# Patient Record
Sex: Female | Born: 1960
Health system: Southern US, Community
[De-identification: ages and names within clinical notes are randomized; demographics above are authoritative.]

## PROBLEM LIST (undated history)

## (undated) DIAGNOSIS — M1990 Unspecified osteoarthritis, unspecified site: Secondary | ICD-10-CM

## (undated) DIAGNOSIS — D649 Anemia, unspecified: Secondary | ICD-10-CM

## (undated) DIAGNOSIS — I10 Essential (primary) hypertension: Secondary | ICD-10-CM

## (undated) DIAGNOSIS — I639 Cerebral infarction, unspecified: Secondary | ICD-10-CM

## (undated) DIAGNOSIS — G7000 Myasthenia gravis without (acute) exacerbation: Secondary | ICD-10-CM

## (undated) DIAGNOSIS — F99 Mental disorder, not otherwise specified: Secondary | ICD-10-CM

## (undated) DIAGNOSIS — E7800 Pure hypercholesterolemia, unspecified: Secondary | ICD-10-CM

## (undated) HISTORY — DX: Pure hypercholesterolemia, unspecified: E78.00

## (undated) HISTORY — PX: BREAST LUMPECTOMY: SHX2

## (undated) HISTORY — DX: Essential (primary) hypertension: I10

## (undated) HISTORY — DX: Cerebral infarction, unspecified: I63.9

## (undated) HISTORY — PX: MASTECTOMY: SHX3

## (undated) HISTORY — DX: Rheumatoid arthritis, unspecified: M06.9

## (undated) HISTORY — PX: OTHER SURGICAL HISTORY: SHX169

## (undated) HISTORY — DX: Mental disorder, not otherwise specified: F99

---

## 2020-07-24 ENCOUNTER — Emergency Department (HOSPITAL_COMMUNITY): Payer: BC Managed Care – PPO

## 2020-07-24 ENCOUNTER — Encounter (HOSPITAL_COMMUNITY): Payer: Self-pay | Admitting: *Deleted

## 2020-07-24 ENCOUNTER — Inpatient Hospital Stay (HOSPITAL_COMMUNITY)
Admission: EM | Admit: 2020-07-24 | Discharge: 2020-07-26 | DRG: 066 | Disposition: A | Discharge status: Home / Self Care | Payer: BC Managed Care – PPO | Attending: Family Medicine | Admitting: Family Medicine

## 2020-07-24 ENCOUNTER — Other Ambulatory Visit: Payer: Self-pay

## 2020-07-24 DIAGNOSIS — K068 Other specified disorders of gingiva and edentulous alveolar ridge: Secondary | ICD-10-CM | POA: Diagnosis present

## 2020-07-24 DIAGNOSIS — E876 Hypokalemia: Secondary | ICD-10-CM | POA: Diagnosis not present

## 2020-07-24 DIAGNOSIS — Z20822 Contact with and (suspected) exposure to covid-19: Secondary | ICD-10-CM | POA: Diagnosis not present

## 2020-07-24 DIAGNOSIS — R4781 Slurred speech: Secondary | ICD-10-CM | POA: Diagnosis not present

## 2020-07-24 DIAGNOSIS — G7000 Myasthenia gravis without (acute) exacerbation: Secondary | ICD-10-CM | POA: Diagnosis present

## 2020-07-24 DIAGNOSIS — I6389 Other cerebral infarction: Secondary | ICD-10-CM | POA: Diagnosis not present

## 2020-07-24 DIAGNOSIS — R1111 Vomiting without nausea: Secondary | ICD-10-CM | POA: Diagnosis not present

## 2020-07-24 DIAGNOSIS — Z8669 Personal history of other diseases of the nervous system and sense organs: Secondary | ICD-10-CM | POA: Diagnosis not present

## 2020-07-24 DIAGNOSIS — R29810 Facial weakness: Secondary | ICD-10-CM | POA: Diagnosis not present

## 2020-07-24 DIAGNOSIS — R000 Tachycardia, unspecified: Secondary | ICD-10-CM | POA: Diagnosis present

## 2020-07-24 DIAGNOSIS — I639 Cerebral infarction, unspecified: Secondary | ICD-10-CM | POA: Diagnosis not present

## 2020-07-24 DIAGNOSIS — R2689 Other abnormalities of gait and mobility: Secondary | ICD-10-CM | POA: Diagnosis present

## 2020-07-24 DIAGNOSIS — M069 Rheumatoid arthritis, unspecified: Secondary | ICD-10-CM | POA: Diagnosis not present

## 2020-07-24 DIAGNOSIS — R9431 Abnormal electrocardiogram [ECG] [EKG]: Secondary | ICD-10-CM | POA: Diagnosis not present

## 2020-07-24 DIAGNOSIS — R772 Abnormality of alphafetoprotein: Secondary | ICD-10-CM | POA: Diagnosis present

## 2020-07-24 DIAGNOSIS — R531 Weakness: Secondary | ICD-10-CM | POA: Diagnosis present

## 2020-07-24 DIAGNOSIS — R1110 Vomiting, unspecified: Secondary | ICD-10-CM

## 2020-07-24 HISTORY — DX: Unspecified osteoarthritis, unspecified site: M19.90

## 2020-07-24 HISTORY — DX: Cerebral infarction, unspecified: I63.9

## 2020-07-24 HISTORY — DX: Myasthenia gravis without (acute) exacerbation: G70.00

## 2020-07-24 LAB — URINALYSIS, ROUTINE W REFLEX MICROSCOPIC
Bacteria, UA: NONE SEEN
Bilirubin Urine: NEGATIVE
Glucose, UA: NEGATIVE mg/dL
Ketones, ur: 20 mg/dL — AB
Leukocytes,Ua: NEGATIVE
Nitrite: NEGATIVE
Protein, ur: NEGATIVE mg/dL
Specific Gravity, Urine: 1.009 (ref 1.005–1.030)
pH: 7 (ref 5.0–8.0)

## 2020-07-24 LAB — COMPREHENSIVE METABOLIC PANEL
ALT: 19 U/L (ref 0–44)
AST: 18 U/L (ref 15–41)
Albumin: 3.8 g/dL (ref 3.5–5.0)
Alkaline Phosphatase: 130 U/L — ABNORMAL HIGH (ref 38–126)
Anion gap: 10 (ref 5–15)
BUN: 12 mg/dL (ref 6–20)
CO2: 25 mmol/L (ref 22–32)
Calcium: 9.1 mg/dL (ref 8.9–10.3)
Chloride: 105 mmol/L (ref 98–111)
Creatinine, Ser: 0.84 mg/dL (ref 0.44–1.00)
GFR, Estimated: >60 mL/min (ref >60)
Glucose, Bld: 83 mg/dL (ref 70–99)
Potassium: 3.4 mmol/L — ABNORMAL LOW (ref 3.5–5.1)
Sodium: 140 mmol/L (ref 135–145)
Total Bilirubin: 0.8 mg/dL (ref 0.3–1.2)
Total Protein: 8.3 g/dL — ABNORMAL HIGH (ref 6.5–8.1)

## 2020-07-24 LAB — CBC WITH DIFFERENTIAL/PLATELET
Abs Immature Granulocytes: 0.06 10*3/uL (ref 0.00–0.07)
Basophils Absolute: 0.1 10*3/uL (ref 0.0–0.1)
Basophils Relative: 1 %
Eosinophils Absolute: 0.1 10*3/uL (ref 0.0–0.5)
Eosinophils Relative: 2 %
HCT: 43.2 % (ref 36.0–46.0)
Hemoglobin: 13.8 g/dL (ref 12.0–15.0)
Immature Granulocytes: 1 %
Lymphocytes Relative: 25 %
Lymphs Abs: 1.5 10*3/uL (ref 0.7–4.0)
MCH: 26.5 pg (ref 26.0–34.0)
MCHC: 31.9 g/dL (ref 30.0–36.0)
MCV: 82.9 fL (ref 80.0–100.0)
Monocytes Absolute: 0.6 10*3/uL (ref 0.1–1.0)
Monocytes Relative: 10 %
Neutro Abs: 3.7 10*3/uL (ref 1.7–7.7)
Neutrophils Relative %: 61 %
Platelets: 350 10*3/uL (ref 150–400)
RBC: 5.21 MIL/uL — ABNORMAL HIGH (ref 3.87–5.11)
RDW: 13 % (ref 11.5–15.5)
WBC: 5.9 10*3/uL (ref 4.0–10.5)
nRBC: 0 % (ref 0.0–0.2)

## 2020-07-24 LAB — RESPIRATORY PANEL BY RT PCR (FLU A&B, COVID)
Influenza A by PCR: NEGATIVE
Influenza B by PCR: NEGATIVE
SARS Coronavirus 2 by RT PCR: NEGATIVE

## 2020-07-24 MED ORDER — PANTOPRAZOLE SODIUM 40 MG IV SOLR
40.0000 mg | INTRAVENOUS | Status: DC
  Administered 2020-07-25 (×2): 40 mg via INTRAVENOUS
  Filled 2020-07-24 (×2): qty 40

## 2020-07-24 MED ORDER — ASPIRIN 81 MG PO CHEW
81.0000 mg | CHEWABLE_TABLET | Freq: Once | ORAL | Status: AC
  Administered 2020-07-24: 81 mg via ORAL
  Filled 2020-07-24: qty 1

## 2020-07-24 MED ORDER — HYDRALAZINE HCL 20 MG/ML IJ SOLN
10.0000 mg | Freq: Once | INTRAMUSCULAR | Status: AC
  Administered 2020-07-24: 10 mg via INTRAVENOUS
  Filled 2020-07-24: qty 1

## 2020-07-24 MED ORDER — HYDRALAZINE HCL 10 MG PO TABS
10.0000 mg | ORAL_TABLET | Freq: Once | ORAL | Status: DC
  Filled 2020-07-24 (×2): qty 1

## 2020-07-24 MED ORDER — PROCHLORPERAZINE EDISYLATE 10 MG/2ML IJ SOLN
10.0000 mg | Freq: Four times a day (QID) | INTRAMUSCULAR | Status: DC | PRN
  Administered 2020-07-25: 10 mg via INTRAVENOUS
  Filled 2020-07-24: qty 2

## 2020-07-24 MED ORDER — POTASSIUM CHLORIDE CRYS ER 20 MEQ PO TBCR
40.0000 meq | EXTENDED_RELEASE_TABLET | Freq: Once | ORAL | Status: AC
  Administered 2020-07-25: 40 meq via ORAL
  Filled 2020-07-24: qty 2

## 2020-07-24 NOTE — ED Provider Notes (Signed)
Patient signed out to me by Langston Masker, PA-C pending completion of work-up.  Patient with history of myasthenia gravis although she has not been on any medications for more than 10 years.  Had thymectomy.  here with facial droop.  Reported last known normal was Sunday.  Physical exam shows some mild right facial weakness and garbled speech.  MRI/MRA ordered.  Previous provider spoke with neurology, Dr. Otelia Limes, who requested to be consulted when MRI completed.   2019 spoke with the neurologist, Dr. Otelia Limes after review of the MRI.  He recommends to start 81 mg aspirin daily and maintain systolic blood pressure between 120-140.  He felt that patient was appropriate to stay at [facility] neurology here tomorrow. Will consult hospitalist  2100  Consulted hospitalist, Dr. Thomes Dinning who agrees to admit   CT Head Wo Contrast  Result Date: 07/24/2020 CLINICAL DATA:  Facial droop EXAM: CT HEAD WITHOUT CONTRAST TECHNIQUE: Contiguous axial images were obtained from the base of the skull through the vertex without intravenous contrast. COMPARISON:  None. FINDINGS: Brain: No acute territorial infarction, hemorrhage or intracranial mass. Minimal right frontal subcortical white matter hypodensity. Ventricles nonenlarged. Prominent right parafalcine calcification. Vascular: No hyperdense vessel or unexpected calcification. Mild carotid vascular calcification Skull: Normal. Negative for fracture or focal lesion. Sinuses/Orbits: No acute finding. Other: None IMPRESSION: 1. No CT evidence for acute intracranial abnormality. 2. Minimal right frontal subcortical white matter hypodensity, nonspecific but probably small vessel ischemic change. Electronically Signed   By: Jasmine Pang M.D.   On: 07/24/2020 17:43   MR ANGIO HEAD WO CONTRAST  Result Date: 07/24/2020 CLINICAL DATA:  Stroke, follow-up. EXAM: MRI HEAD WITHOUT CONTRAST MRA HEAD WITHOUT CONTRAST TECHNIQUE: Multiplanar, multiecho pulse sequences of the brain  and surrounding structures were obtained without intravenous contrast. Angiographic images of the head were obtained using MRA technique without contrast. COMPARISON:  Noncontrast head CT performed earlier the same day 07/24/2020. FINDINGS: MRI HEAD FINDINGS Brain: Cerebral volume is normal for age. 1.0 x 1.0 x 2.3 cm (AP x TV x CC) focus of restricted diffusion within the left corona radiata/lentiform nucleus consistent with acute/early subacute infarction. Corresponding T2/FLAIR hyperintensity at this site. Mild patchy T2/FLAIR hyperintensity elsewhere within the cerebral white matter is nonspecific, but compatible with chronic small vessel ischemic disease. No evidence of intracranial mass. No chronic intracranial blood products. No extra-axial fluid collection. No midline shift. Vascular: Reported below. Skull and upper cervical spine: No focal marrow lesion. Sinuses/Orbits: Visualized orbits show no acute finding. Small mucous retention cyst within the posterior left ethmoid air cell. No significant mastoid effusion. MRA HEAD FINDINGS The intracranial internal carotid arteries are patent. The M1 middle cerebral arteries are patent without significant stenosis. No M2 proximal branch occlusion or high-grade proximal stenosis is identified. Atherosclerotic irregularity of the M2 and more distal MCA branch vessels bilaterally. The anterior cerebral arteries are patent. The intracranial vertebral arteries are patent. The basilar artery is patent. The posterior cerebral arteries are patent. Severe stenosis within the distal P2 right posterior cerebral artery (series 1037, image 13). Severe stenosis within the proximal P2 left posterior cerebral artery (series 1037, image 11). Additional high-grade stenoses more distally within the left posterior cerebral artery within the P2 segment and P2/P3 junction (series 1037, image 13). 2 mm superiorly projecting aneurysm arising from a proximal left M2 MCA branch vessel  (series 9, images 129 and 130). IMPRESSION: MRI brain: 1. 2.3 cm acute/early subacute infarct within the left corona radiata/lentiform nucleus. 2. Background mild cerebral  white matter chronic small vessel ischemic disease. MRA head: 1. No intracranial large vessel occlusion. 2. Intracranial atherosclerotic disease with multifocal stenoses, most notably as follows. 3. Severe stenosis within the distal P2 right posterior cerebral artery. 4. Severe stenosis within the proximal P2 left posterior cerebral artery. 5. Additional multifocal high-grade stenoses more distally within the left posterior cerebral artery within the P2 segment and P2/P3 junction. 6. 2 mm superiorly projecting aneurysm arising from a proximal left M2 MCA branch vessel. Electronically Signed   By: Jackey Loge DO   On: 07/24/2020 19:07   MR BRAIN WO CONTRAST  Result Date: 07/24/2020 CLINICAL DATA:  Stroke, follow-up. EXAM: MRI HEAD WITHOUT CONTRAST MRA HEAD WITHOUT CONTRAST TECHNIQUE: Multiplanar, multiecho pulse sequences of the brain and surrounding structures were obtained without intravenous contrast. Angiographic images of the head were obtained using MRA technique without contrast. COMPARISON:  Noncontrast head CT performed earlier the same day 07/24/2020. FINDINGS: MRI HEAD FINDINGS Brain: Cerebral volume is normal for age. 1.0 x 1.0 x 2.3 cm (AP x TV x CC) focus of restricted diffusion within the left corona radiata/lentiform nucleus consistent with acute/early subacute infarction. Corresponding T2/FLAIR hyperintensity at this site. Mild patchy T2/FLAIR hyperintensity elsewhere within the cerebral white matter is nonspecific, but compatible with chronic small vessel ischemic disease. No evidence of intracranial mass. No chronic intracranial blood products. No extra-axial fluid collection. No midline shift. Vascular: Reported below. Skull and upper cervical spine: No focal marrow lesion. Sinuses/Orbits: Visualized orbits show no acute  finding. Small mucous retention cyst within the posterior left ethmoid air cell. No significant mastoid effusion. MRA HEAD FINDINGS The intracranial internal carotid arteries are patent. The M1 middle cerebral arteries are patent without significant stenosis. No M2 proximal branch occlusion or high-grade proximal stenosis is identified. Atherosclerotic irregularity of the M2 and more distal MCA branch vessels bilaterally. The anterior cerebral arteries are patent. The intracranial vertebral arteries are patent. The basilar artery is patent. The posterior cerebral arteries are patent. Severe stenosis within the distal P2 right posterior cerebral artery (series 1037, image 13). Severe stenosis within the proximal P2 left posterior cerebral artery (series 1037, image 11). Additional high-grade stenoses more distally within the left posterior cerebral artery within the P2 segment and P2/P3 junction (series 1037, image 13). 2 mm superiorly projecting aneurysm arising from a proximal left M2 MCA branch vessel (series 9, images 129 and 130). IMPRESSION: MRI brain: 1. 2.3 cm acute/early subacute infarct within the left corona radiata/lentiform nucleus. 2. Background mild cerebral white matter chronic small vessel ischemic disease. MRA head: 1. No intracranial large vessel occlusion. 2. Intracranial atherosclerotic disease with multifocal stenoses, most notably as follows. 3. Severe stenosis within the distal P2 right posterior cerebral artery. 4. Severe stenosis within the proximal P2 left posterior cerebral artery. 5. Additional multifocal high-grade stenoses more distally within the left posterior cerebral artery within the P2 segment and P2/P3 junction. 6. 2 mm superiorly projecting aneurysm arising from a proximal left M2 MCA branch vessel. Electronically Signed   By: Jackey Loge DO   On: 07/24/2020 19:07      Pauline Aus, PA-C 07/24/20 2121    Bethann Berkshire, MD 07/25/20 [phone]

## 2020-07-24 NOTE — ED Triage Notes (Signed)
Pt just moved here from [place] and does not have a PCP.  Pt needs refill on meds for myasthenia gravis.

## 2020-07-24 NOTE — Clinical Social Work Note (Signed)
Transition of Care [facility]) - Emergency Department Mini Assessment   Patient Details  Name: Barbara Steele MRN: [id number] Date of Birth: May 04, 1961  [facility]) CM/SW Contact:    Villa Herb, LCSWA Phone Number: [phone] 07/24/2020, 4:58 PM  Clinical Narrative: CSW visited pt in ED due to pt having no PCP. Per RN note pt is new resident in [place] so pt does not have PCP established. CSW provided pt with PCP resource list. TOC signing off.   ED Mini Assessment: What brought you to the Emergency Department? : Medication refill  Barriers to Discharge: ED PCP establishment  Barrier interventions: PCP resource list provided  Means of departure: Car  Interventions which prevented an admission or readmission: Other (must enter comment) (PCP resource list provided)  Patient Contact and Communications Choice offered to / list presented to : NA  Admission diagnosis:  Needs medicine refill  There are no problems to display for this patient.  PCP:  Pcp, No Pharmacy:  No Pharmacies Listed

## 2020-07-24 NOTE — ED Provider Notes (Signed)
[facility] EMERGENCY DEPARTMENT Provider Note   CSN: [id number] Arrival date & time: 07/24/20  1540     History Chief Complaint  Patient presents with  . medication refill    Barbara Steele is a 59 y.o. female.  The history is provided by the patient. No language interpreter was used.  Weakness Severity:  Moderate Onset quality:  Gradual Duration:  2 days Timing:  Constant Progression:  Unchanged Chronicity:  New Relieved by:  Nothing Worsened by:  Nothing Ineffective treatments:  None tried Associated symptoms: ataxia and stroke symptoms   Associated symptoms: no abdominal pain, no numbness in extremities and no headaches   Risk factors: no anemia    Pt has a history of myasthenia gravis and had a thymectomy 10 years ago.  Pt has not taken any medications in 10 years.  Pt recently moved here from [place].  Pt reports her job is stressful and she thinks her job has triggered symptoms. Pt's daughter reports pt is off balance and her speech has been garbled.     Past Medical History:  Diagnosis Date  . Arthritis   . Myasthenia gravis (HCC)     There are no problems to display for this patient.   Past Surgical History:  Procedure Laterality Date  . MASTECTOMY       OB History   No obstetric history on file.     History reviewed. No pertinent family history.  Social History   Tobacco Use  . Smoking status: Never Smoker  . Smokeless tobacco: Never Used  Substance Use Topics  . Alcohol use: Not Currently  . Drug use: Never    Home Medications Prior to Admission medications   Not on File    Allergies    Patient has no known allergies.  Review of Systems   Review of Systems  Gastrointestinal: Negative for abdominal pain.  Neurological: Positive for weakness. Negative for headaches.  All other systems reviewed and are negative.   Physical Exam Updated Vital Signs BP (!) 207/128 (BP Location: Right Arm)   Pulse (!) 129   Temp 98.9 F  (37.2 C) (Oral)   Resp 20   Ht 5\' 7"  (1.702 m)   Wt 77.1 kg   SpO2 93%   BMI 26.63 kg/m   Physical Exam Vitals and nursing note reviewed.  Constitutional:      Appearance: She is well-developed.  HENT:     Head: Normocephalic.     Nose: Nose normal.     Mouth/Throat:     Mouth: Mucous membranes are moist.  Eyes:     Pupils: Pupils are equal, round, and reactive to light.  Cardiovascular:     Rate and Rhythm: Normal rate and regular rhythm.  Pulmonary:     Effort: Pulmonary effort is normal.  Abdominal:     General: There is no distension.  Musculoskeletal:        General: Normal range of motion.     Cervical back: Normal range of motion.  Neurological:     Mental Status: She is alert and oriented to person, place, and time.     Motor: Weakness present.     Comments: Mild right facial weakness. Speech garbles   Psychiatric:        Mood and Affect: Mood normal.     ED Results / Procedures / Treatments   Labs (all labs ordered are listed, but only abnormal results are displayed) Labs Reviewed  CBC WITH DIFFERENTIAL/PLATELET - Abnormal; Notable for  the following components:      Result Value   RBC 5.21 (*)    All other components within normal limits  COMPREHENSIVE METABOLIC PANEL - Abnormal; Notable for the following components:   Potassium 3.4 (*)    Total Protein 8.3 (*)    Alkaline Phosphatase 130 (*)    All other components within normal limits  URINALYSIS, ROUTINE W REFLEX MICROSCOPIC    EKG None  Radiology CT Head Wo Contrast  Result Date: 07/24/2020 CLINICAL DATA:  Facial droop EXAM: CT HEAD WITHOUT CONTRAST TECHNIQUE: Contiguous axial images were obtained from the base of the skull through the vertex without intravenous contrast. COMPARISON:  None. FINDINGS: Brain: No acute territorial infarction, hemorrhage or intracranial mass. Minimal right frontal subcortical white matter hypodensity. Ventricles nonenlarged. Prominent right parafalcine  calcification. Vascular: No hyperdense vessel or unexpected calcification. Mild carotid vascular calcification Skull: Normal. Negative for fracture or focal lesion. Sinuses/Orbits: No acute finding. Other: None IMPRESSION: 1. No CT evidence for acute intracranial abnormality. 2. Minimal right frontal subcortical white matter hypodensity, nonspecific but probably small vessel ischemic change. Electronically Signed   By: Jasmine Pang M.D.   On: 07/24/2020 17:43    Procedures Procedures (including critical care time)  Medications Ordered in ED Medications - No data to display  ED Course  I have reviewed the triage vital signs and the nursing notes.  Pertinent labs & imaging results that were available during my care of the patient were reviewed by me and considered in my medical decision making (see chart for details).    MDM Rules/Calculators/A&P                          I spoke with Dr. Otelia Limes who advised MRI  Final Clinical Impression(s) / ED Diagnoses Final diagnoses:  Cerebrovascular accident (CVA), unspecified mechanism [facility])    Rx / DC Orders ED Discharge Orders    None       Osie Cheeks 07/25/20 Mertie Moores    Bethann Berkshire, MD 07/25/20 [phone]

## 2020-07-24 NOTE — H&P (Signed)
History and Physical  Barbara Steele LOV:[id number] DOB: 1961-05-26 DOA: 07/24/2020  Referring physician: Osie Cheeks PCP: Pcp, No  Patient coming from: Home  Chief Complaint: Slurred Speech  HPI: Barbara Steele is a 59 y.o. female with medical history significant for myasthenia gravis s/p thymectomy (10 years ago and not on medication) who presents to the emergency department due to slurred speech and gait imbalance.  Patient states that she first noted mild right facial droop on Monday (10/18), but this appears to have improved slightly.  She complained of noting weakness in her right fingers (she is left-handed) and mild slurred speech yesterday (10/20) and today, daughter noted that her gait was slightly off balance, so she presents to the emergency department with thoughts that symptoms could be related to her chronic history of myasthenia gravis.  She recently moved to this area from [place], [place], she states that her job was stressful and thinks that it: I have triggered her symptoms.  She denies chest pain, shortness of breath, headache, blurry vision, fever, chills.  ED Course:  In the emergency department, she was tachycardic and BP was 207/128 on arrival to the ED.  Work-up in the ED showed normal CBC and mild hypokalemia (K+ 3.4), ALP 130, urinalysis was positive for ketones and small urine hemoglobin.  Respiratory panel by RT-PCR was negative for influenza A, B and SARS coronavirus 2.  CTA without contrast showed no CT evidence of acute intracranial abnormality but showed minimal right frontal subcortical white matter hypodensity, nonspecific but probably small vessel ischemic change.  MRI head without contrast showed 2.3 cm acute/early subacute infarct within the left corona radiata/lentiform nucleus with background mild cerebral white matter chronic small vessel ischemic disease.  MRA head showed no intracranial large vessel occlusion.  Dr. Otelia Limes was initially  consulted in the ED prior to MRI of brain was reconsulted recommended aspirin 81 mg daily and to maintain systolic blood pressure between 120-140 with recommendation to consult with neurology here at any pain tomorrow.  Hospitalist was asked to admit patient for further evaluation and management.  Review of Systems: Constitutional: Negative for chills and fever.  HENT: Negative for ear pain and sore throat.   Eyes: Negative for pain and visual disturbance.  Respiratory: Negative for cough, chest tightness and shortness of breath.   Cardiovascular: Negative for chest pain and palpitations.  Gastrointestinal: Negative for abdominal pain and vomiting.  Endocrine: Negative for polyphagia and polyuria.  Genitourinary: Negative for decreased urine volume, dysuria Musculoskeletal: Negative for arthralgias and back pain.  Skin: Negative for color change and rash.  Allergic/Immunologic: Negative for immunocompromised state.  Neurological: Positive for speech difficulty, gait imbalance. Negative for tremors, syncope, light-headedness and headaches.  Hematological: Does not bruise/bleed easily.  All other systems reviewed and are negative  Past Medical History:  Diagnosis Date  . Arthritis   . Myasthenia gravis [facility])    Past Surgical History:  Procedure Laterality Date  . MASTECTOMY      Social History:  reports that she has never smoked. She has never used smokeless tobacco. She reports previous alcohol use. She reports that she does not use drugs.   No Known Allergies  History reviewed. No pertinent family history.   Prior to Admission medications   Not on File    Physical Exam: BP (!) 191/95 (BP Location: Right Arm)   Pulse (!) 106   Temp 98.7 F (37.1 C) (Oral)   Resp 20   Ht 5\' 7"  (1.702 m)  Wt 77.1 kg   SpO2 93%   BMI 26.63 kg/m   . General: 59 y.o. year-old female well developed well nourished in no acute distress.  Alert and oriented x3. Marland Kitchen HEENT: NCAT, EOMI . Neck:  Supple, trachea midline . Cardiovascular: Regular rate and rhythm with no rubs or gallops.  No thyromegaly or JVD noted.  No lower extremity edema. 2/4 pulses in all 4 extremities. Marland Kitchen Respiratory: Clear to auscultation with no wheezes or rales. Good inspiratory effort. . Abdomen: Soft nontender nondistended with normal bowel sounds x4 quadrants. . Muskuloskeletal: No cyanosis, clubbing or edema noted bilaterally . Neuro: CN II-XII intact, strength, sensation, reflexes intact.  Gait not tested . Skin: No ulcerative lesions noted or rashes . Psychiatry: Judgement and insight appear normal. Mood is appropriate for condition and setting          Labs on Admission:  Basic Metabolic Panel: Recent Labs  Lab 07/24/20 1648  NA 140  K 3.4*  CL 105  CO2 25  GLUCOSE 83  BUN 12  CREATININE 0.84  CALCIUM 9.1   Liver Function Tests: Recent Labs  Lab 07/24/20 1648  AST 18  ALT 19  ALKPHOS 130*  BILITOT 0.8  PROT 8.3*  ALBUMIN 3.8   No results for input(s): LIPASE, AMYLASE in the last 168 hours. No results for input(s): AMMONIA in the last 168 hours. CBC: Recent Labs  Lab 07/24/20 1648  WBC 5.9  NEUTROABS 3.7  HGB 13.8  HCT 43.2  MCV 82.9  PLT 350   Cardiac Enzymes: No results for input(s): CKTOTAL, CKMB, CKMBINDEX, TROPONINI in the last 168 hours.  BNP (last 3 results) No results for input(s): BNP in the last 8760 hours.  ProBNP (last 3 results) No results for input(s): PROBNP in the last 8760 hours.  CBG: No results for input(s): GLUCAP in the last 168 hours.  Radiological Exams on Admission: CT Head Wo Contrast  Result Date: 07/24/2020 CLINICAL DATA:  Facial droop EXAM: CT HEAD WITHOUT CONTRAST TECHNIQUE: Contiguous axial images were obtained from the base of the skull through the vertex without intravenous contrast. COMPARISON:  None. FINDINGS: Brain: No acute territorial infarction, hemorrhage or intracranial mass. Minimal right frontal subcortical white matter  hypodensity. Ventricles nonenlarged. Prominent right parafalcine calcification. Vascular: No hyperdense vessel or unexpected calcification. Mild carotid vascular calcification Skull: Normal. Negative for fracture or focal lesion. Sinuses/Orbits: No acute finding. Other: None IMPRESSION: 1. No CT evidence for acute intracranial abnormality. 2. Minimal right frontal subcortical white matter hypodensity, nonspecific but probably small vessel ischemic change. Electronically Signed   By: Jasmine Pang M.D.   On: 07/24/2020 17:43   MR ANGIO HEAD WO CONTRAST  Result Date: 07/24/2020 CLINICAL DATA:  Stroke, follow-up. EXAM: MRI HEAD WITHOUT CONTRAST MRA HEAD WITHOUT CONTRAST TECHNIQUE: Multiplanar, multiecho pulse sequences of the brain and surrounding structures were obtained without intravenous contrast. Angiographic images of the head were obtained using MRA technique without contrast. COMPARISON:  Noncontrast head CT performed earlier the same day 07/24/2020. FINDINGS: MRI HEAD FINDINGS Brain: Cerebral volume is normal for age. 1.0 x 1.0 x 2.3 cm (AP x TV x CC) focus of restricted diffusion within the left corona radiata/lentiform nucleus consistent with acute/early subacute infarction. Corresponding T2/FLAIR hyperintensity at this site. Mild patchy T2/FLAIR hyperintensity elsewhere within the cerebral white matter is nonspecific, but compatible with chronic small vessel ischemic disease. No evidence of intracranial mass. No chronic intracranial blood products. No extra-axial fluid collection. No midline shift. Vascular: Reported below. Skull  and upper cervical spine: No focal marrow lesion. Sinuses/Orbits: Visualized orbits show no acute finding. Small mucous retention cyst within the posterior left ethmoid air cell. No significant mastoid effusion. MRA HEAD FINDINGS The intracranial internal carotid arteries are patent. The M1 middle cerebral arteries are patent without significant stenosis. No M2 proximal branch  occlusion or high-grade proximal stenosis is identified. Atherosclerotic irregularity of the M2 and more distal MCA branch vessels bilaterally. The anterior cerebral arteries are patent. The intracranial vertebral arteries are patent. The basilar artery is patent. The posterior cerebral arteries are patent. Severe stenosis within the distal P2 right posterior cerebral artery (series 1037, image 13). Severe stenosis within the proximal P2 left posterior cerebral artery (series 1037, image 11). Additional high-grade stenoses more distally within the left posterior cerebral artery within the P2 segment and P2/P3 junction (series 1037, image 13). 2 mm superiorly projecting aneurysm arising from a proximal left M2 MCA branch vessel (series 9, images 129 and 130). IMPRESSION: MRI brain: 1. 2.3 cm acute/early subacute infarct within the left corona radiata/lentiform nucleus. 2. Background mild cerebral white matter chronic small vessel ischemic disease. MRA head: 1. No intracranial large vessel occlusion. 2. Intracranial atherosclerotic disease with multifocal stenoses, most notably as follows. 3. Severe stenosis within the distal P2 right posterior cerebral artery. 4. Severe stenosis within the proximal P2 left posterior cerebral artery. 5. Additional multifocal high-grade stenoses more distally within the left posterior cerebral artery within the P2 segment and P2/P3 junction. 6. 2 mm superiorly projecting aneurysm arising from a proximal left M2 MCA branch vessel. Electronically Signed   By: Jackey Loge DO   On: 07/24/2020 19:07   MR BRAIN WO CONTRAST  Result Date: 07/24/2020 CLINICAL DATA:  Stroke, follow-up. EXAM: MRI HEAD WITHOUT CONTRAST MRA HEAD WITHOUT CONTRAST TECHNIQUE: Multiplanar, multiecho pulse sequences of the brain and surrounding structures were obtained without intravenous contrast. Angiographic images of the head were obtained using MRA technique without contrast. COMPARISON:  Noncontrast head CT  performed earlier the same day 07/24/2020. FINDINGS: MRI HEAD FINDINGS Brain: Cerebral volume is normal for age. 1.0 x 1.0 x 2.3 cm (AP x TV x CC) focus of restricted diffusion within the left corona radiata/lentiform nucleus consistent with acute/early subacute infarction. Corresponding T2/FLAIR hyperintensity at this site. Mild patchy T2/FLAIR hyperintensity elsewhere within the cerebral white matter is nonspecific, but compatible with chronic small vessel ischemic disease. No evidence of intracranial mass. No chronic intracranial blood products. No extra-axial fluid collection. No midline shift. Vascular: Reported below. Skull and upper cervical spine: No focal marrow lesion. Sinuses/Orbits: Visualized orbits show no acute finding. Small mucous retention cyst within the posterior left ethmoid air cell. No significant mastoid effusion. MRA HEAD FINDINGS The intracranial internal carotid arteries are patent. The M1 middle cerebral arteries are patent without significant stenosis. No M2 proximal branch occlusion or high-grade proximal stenosis is identified. Atherosclerotic irregularity of the M2 and more distal MCA branch vessels bilaterally. The anterior cerebral arteries are patent. The intracranial vertebral arteries are patent. The basilar artery is patent. The posterior cerebral arteries are patent. Severe stenosis within the distal P2 right posterior cerebral artery (series 1037, image 13). Severe stenosis within the proximal P2 left posterior cerebral artery (series 1037, image 11). Additional high-grade stenoses more distally within the left posterior cerebral artery within the P2 segment and P2/P3 junction (series 1037, image 13). 2 mm superiorly projecting aneurysm arising from a proximal left M2 MCA branch vessel (series 9, images 129 and 130). IMPRESSION: MRI brain:  1. 2.3 cm acute/early subacute infarct within the left corona radiata/lentiform nucleus. 2. Background mild cerebral white matter chronic  small vessel ischemic disease. MRA head: 1. No intracranial large vessel occlusion. 2. Intracranial atherosclerotic disease with multifocal stenoses, most notably as follows. 3. Severe stenosis within the distal P2 right posterior cerebral artery. 4. Severe stenosis within the proximal P2 left posterior cerebral artery. 5. Additional multifocal high-grade stenoses more distally within the left posterior cerebral artery within the P2 segment and P2/P3 junction. 6. 2 mm superiorly projecting aneurysm arising from a proximal left M2 MCA branch vessel. Electronically Signed   By: Jackey Loge DO   On: 07/24/2020 19:07    EKG: I independently viewed the EKG done and my findings are as followed: Sinus tachycardia at a rate of 102 bpm with prolonged QTc at rate of and nonspecific ST abnormality  Assessment/Plan Present on Admission: **None**  Principal Problem:   Acute ischemic stroke (HCC) Active Problems:   Hypokalemia   Vomiting   Elevated alpha fetoprotein   Prolonged QT interval   History of myasthenia gravis   Acute/subacute ischemic stroke MRI head without contrast showed 2.3 cm acute/early subacute infarct within the left corona radiata/lentiform nucleus with background mild cerebral white matter chronic small vessel ischemic disease. Patient will be admitted to telemetry unit  Echocardiogram in the morning Continue aspirin 81 mg Continue fall precautions and neuro checks Lipid panel and hemoglobin A1c will be checked Continue PT/ST/OT eval and treat Bedside swallow eval by nursing prior to diet Neurology will be consulted and we shall await further recommendation.   Hypokalemia K+ 3.4, this will be replenished  Vomiting/questionable GI bleed Patient was reported to have vomited bloodstained vomitus, however on further questioning, patient states that she usually have bleeding gums which could have been the cause of blood noted on vomitus Continue IV Compazine 10 mg every 6  hours as needed Continue Protonix IV 40 mg daily at this time Continue to monitor H/H to ensure no drop in hemoglobin  Prolonged QT interval QTc Avoid QT prolonging drugs Magnesium level will be checked Repeat EKG in the morning  Elevated ALP ALP 130, patient denies any abdominal pain Continue to monitor liver panel with morning labs  DVT prophylaxis: SCDs  Code Status: Full code  Family Communication: Daughter at bedside (all questions answered to satisfaction)  Disposition Plan:  Patient is from:                        home Anticipated DC to:                   home Anticipated DC date:               1day Anticipated DC barriers:          Patient unstable to be discharged at this time due to acute/subacute ischemic stroke which requires further stroke work-up and neurology consult with further recommendation.  Consults called: Neurology  Admission status: Observation    Frankey Shown MD [facility]  07/24/2020, 11:44 PM

## 2020-07-25 ENCOUNTER — Encounter (HOSPITAL_COMMUNITY): Payer: Self-pay | Admitting: Family Medicine

## 2020-07-25 ENCOUNTER — Observation Stay (HOSPITAL_COMMUNITY): Payer: BC Managed Care – PPO

## 2020-07-25 DIAGNOSIS — R4781 Slurred speech: Secondary | ICD-10-CM | POA: Diagnosis present

## 2020-07-25 DIAGNOSIS — R531 Weakness: Secondary | ICD-10-CM | POA: Diagnosis present

## 2020-07-25 DIAGNOSIS — Z20822 Contact with and (suspected) exposure to covid-19: Secondary | ICD-10-CM | POA: Diagnosis present

## 2020-07-25 DIAGNOSIS — R29810 Facial weakness: Secondary | ICD-10-CM | POA: Diagnosis present

## 2020-07-25 DIAGNOSIS — I639 Cerebral infarction, unspecified: Secondary | ICD-10-CM | POA: Diagnosis present

## 2020-07-25 DIAGNOSIS — R772 Abnormality of alphafetoprotein: Secondary | ICD-10-CM | POA: Diagnosis present

## 2020-07-25 DIAGNOSIS — E876 Hypokalemia: Secondary | ICD-10-CM | POA: Diagnosis present

## 2020-07-25 DIAGNOSIS — I6389 Other cerebral infarction: Secondary | ICD-10-CM | POA: Diagnosis not present

## 2020-07-25 DIAGNOSIS — M069 Rheumatoid arthritis, unspecified: Secondary | ICD-10-CM | POA: Diagnosis present

## 2020-07-25 DIAGNOSIS — R9431 Abnormal electrocardiogram [ECG] [EKG]: Secondary | ICD-10-CM | POA: Diagnosis present

## 2020-07-25 DIAGNOSIS — R000 Tachycardia, unspecified: Secondary | ICD-10-CM | POA: Diagnosis present

## 2020-07-25 DIAGNOSIS — R2689 Other abnormalities of gait and mobility: Secondary | ICD-10-CM | POA: Diagnosis present

## 2020-07-25 DIAGNOSIS — G7000 Myasthenia gravis without (acute) exacerbation: Secondary | ICD-10-CM | POA: Diagnosis present

## 2020-07-25 DIAGNOSIS — K068 Other specified disorders of gingiva and edentulous alveolar ridge: Secondary | ICD-10-CM | POA: Diagnosis present

## 2020-07-25 LAB — ECHOCARDIOGRAM COMPLETE
AR max vel: 2.23 cm2
AV Area VTI: 2.58 cm2
AV Area mean vel: 2.17 cm2
AV Mean grad: 2.8 mmHg
AV Peak grad: 6 mmHg
Ao pk vel: 1.22 m/s
Area-P 1/2: 3.27 cm2
Height: 67 in
S' Lateral: 2.22 cm
Weight: 2720 oz

## 2020-07-25 LAB — LIPID PANEL
Cholesterol: 230 mg/dL — ABNORMAL HIGH (ref 0–200)
HDL: 50 mg/dL (ref >40)
LDL Cholesterol: 161 mg/dL — ABNORMAL HIGH (ref 0–99)
Total CHOL/HDL Ratio: 4.6 RATIO
Triglycerides: 94 mg/dL (ref <150)
VLDL: 19 mg/dL (ref 0–40)

## 2020-07-25 NOTE — Evaluation (Signed)
Speech Language Pathology Evaluation Patient Details Name: Barbara Steele MRN: [id number] DOB: 1961/03/11 Today's Date: 07/25/2020 Time:  -     Problem List:  Patient Active Problem List   Diagnosis Date Noted  . Acute ischemic stroke (HCC) 07/24/2020  . Hypokalemia 07/24/2020  . Vomiting 07/24/2020  . Elevated alpha fetoprotein 07/24/2020  . Prolonged QT interval 07/24/2020  . History of myasthenia gravis 07/24/2020   Past Medical History:  Past Medical History:  Diagnosis Date  . Arthritis   . Myasthenia gravis [facility])    Past Surgical History:  Past Surgical History:  Procedure Laterality Date  . MASTECTOMY     HPI:  Barbara Steele is a 59 y.o. female with medical history significant for myasthenia gravis s/p thymectomy (10 years ago and not on medication) who presents to the emergency department due to slurred speech and gait imbalance.  Patient states that she first noted mild right facial droop on Monday (10/18), but this appears to have improved slightly.  She complained of noting weakness in her right fingers (she is left-handed) and mild slurred speech yesterday (10/20) and today, daughter noted that her gait was slightly off balance, so she presents to the emergency department with thoughts that symptoms could be related to her chronic history of myasthenia gravis.  She recently moved to this area from [place], [place], she states that her job was stressful and thinks that it: I have triggered her symptoms.  She denies chest pain, shortness of breath, headache, blurry vision, fever, chills.    Assessment / Plan / Recommendation Clinical Impression  Speech Language evaluation completed while Pt was sitting upright in bed; Pt presents with mild dysarthria and mild cognitive impairment. Pt was administered portions of the [facility] SLUMS Examination and demonstrated mild impairment with memory recalling 4/5 objects, difficulty with generational naming and noted slow processing  overall and specifically with calculations. 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 Pt also presents with mild dysarthria with intelligibility decreased to 75% accuracy characterized by overall oral weakness resulting in decreased articulatory precision; Pt was very stimulable with strategies for dysarthria including over articulation and slowed rate. Pt will benefit from a full, more in-depth Speech Language evaluation at next level of care and speech therapy as indicated for dysarthria and cognition either at Outpatient or with Home Health. Results reviewed with Pt and Pt in agreement. Thank you,    SLP Assessment  SLP Recommendation/Assessment: All further Speech Lanaguage Pathology  needs can be addressed in the next venue of care SLP Visit Diagnosis: Cognitive communication deficit (R41.841);Dysarthria and anarthria (R47.1)    Follow Up Recommendations  Outpatient SLP;Home health SLP    Frequency and Duration           SLP Evaluation Cognition  Overall Cognitive Status: Impaired/Different from baseline Arousal/Alertness: Awake/alert Orientation Level: Oriented X4 Attention: Selective Selective Attention: Impaired Selective Attention Impairment: Verbal complex Memory: Impaired Memory Impairment: Decreased short term memory Decreased Short Term Memory: Verbal basic Awareness: Appears intact Problem Solving: Appears intact       Comprehension  Auditory Comprehension Overall Auditory Comprehension: Appears within functional limits for tasks assessed Yes/No Questions: Within Functional Limits Commands: Within Functional Limits Visual Recognition/Discrimination Discrimination: Within Function Limits Reading Comprehension Reading Status: Within funtional limits    Expression Expression Primary Mode of Expression: Verbal Verbal Expression Overall Verbal Expression: Appears  within functional limits  for tasks assessed Initiation: No impairment Written Expression Dominant Hand: Left   Oral / Motor  Oral Motor/Sensory Function Overall Oral Motor/Sensory Function: Generalized oral weakness Motor Speech Overall Motor Speech: Impaired Articulation: Impaired Level of Impairment: Word Intelligibility: Intelligibility reduced Word: 75-100% accurate Phrase: 75-100% accurate Sentence: 75-100% accurate Conversation: 75-100% accurate     Jmya Uliano H. Romie Levee, CCC-SLP Speech Language Pathologist         Georgetta Haber 07/25/2020, 12:53 PM

## 2020-07-25 NOTE — Progress Notes (Signed)
PROGRESS NOTE    Barbara Steele  ZOX:[id number] DOB: 05/28/1961 DOA: 07/24/2020 PCP: Pcp, No  Brief Narrative:  59 year old black female Known myasthenia gravis status post thymectomy 10 years ago not currently on any meds, prior mastectomy Presented to [facility] emergency room off balance with garbled speech Blood pressure 207/128 potassium 3.4 CT head minimal right frontal subcortical white matter hypodensity nonspecific-MRI head 2.3 cm Neurologist Dr. Cheral Marker consulted by ED recommending aspirin 81 daily and neurology consult at [facility] where she can stay Also found to have bleeding gums which is chronic for her   Assessment & Plan:   Principal Problem:   Acute ischemic stroke [facility]) Active Problems:   Hypokalemia   Vomiting   Elevated alpha fetoprotein   Prolonged QT interval   History of myasthenia gravis   1. Acute ischemic 2.3 cm subacute infarct left corona radiata/lentiform a. Still has deficit to some degree with speech all no motor deficit seen resolved b. Speech therapy will do speech language evaluation to facilitate c. Finish further work-up with echo, carotid etc. and therapy evaluations to be completed d. Await further input from neurologist in the meantime continue aspirin 81 mg 2. History of myasthenia gravis X 30 years status post thymectomy in the past, last treated 10 years ago with steroids a. Not on treatment now and do not think that this is a myasthenia flare b. May require outpatient coordination with neurologist await neurology input 3. Bleeding gums a. No further concerns monitor only repeat labs daily instead of more frequently 4. Self-reported history of rheumatoid arthritis previously on prednisone a. No symptoms at this stage of stage as she is without symptoms will hold off on lab evaluation b. May need CCP ESR CRP as an outpatient 5. Prolonged QTC a. Keep on telemetry transiently and then monitor  DVT prophylaxis: Lovenox Code Status:  Full CODE STATUS Family Communication: Discussed with daughter at the bedside Disposition:   Status is: Observation  The patient will require care spanning > 2 midnights and should be moved to inpatient because: Ongoing diagnostic testing needed not appropriate for outpatient work up and Unsafe d/c plan  Dispo: The patient is from: Home              Anticipated d/c is to: Home              Anticipated d/c date is: 1 day              Patient currently is not medically stable to d/c.       Consultants:   Neurologist  Procedures: None  Antimicrobials: None   Subjective: Overall her symptoms have improved She has no fever chills pain She states that her coordination is back although she still misses some words  Objective: Vitals:   07/25/20 0430 07/25/20 0500 07/25/20 0530 07/25/20 0630  BP: 133/80 138/81 (!) 141/75 (!) 146/79  Pulse: 89 83 84 84  Resp: 18 16 16 16   Temp:      TempSrc:      SpO2: 95% 92% 96% 95%  Weight:      Height:       No intake or output data in the 24 hours ending 07/25/20 0728 Filed Weights   07/24/20 1550  Weight: 77.1 kg    Examination:  General exam: EOMI NCAT no focal deficit but slight tongue protrusion to the right >left No icterus no pallor finger-nose-finger is smooth and coordinated Smile is symmetric Forehead wrinkles equally Respiratory system: Clear  no added sound Cardiovascular system: S1-S2 no murmur Gastrointestinal system: Soft nontender no rebound. Central nervous system: As above in addition power 5/5 reflexes are slightly brisk 3/3 in the knees and in brachioradialis sensory to light touch performed and intact plan Extremities: No lower extremity edema however patient does have contractures of upper extremities probably from fusion of her joints Skin: No edema Psychiatry: Euthymic and congruent  Data Reviewed: I have personally reviewed following labs and imaging studies Potassium 3.4 BUN/creatinine 12/0.8 LDL 161  total cholesterol 230 White count 5.9 platelet 350 hemoglobin 13.8  Radiology Studies: CT Head Wo Contrast  Result Date: 07/24/2020 CLINICAL DATA:  Facial droop EXAM: CT HEAD WITHOUT CONTRAST TECHNIQUE: Contiguous axial images were obtained from the base of the skull through the vertex without intravenous contrast. COMPARISON:  None. FINDINGS: Brain: No acute territorial infarction, hemorrhage or intracranial mass. Minimal right frontal subcortical white matter hypodensity. Ventricles nonenlarged. Prominent right parafalcine calcification. Vascular: No hyperdense vessel or unexpected calcification. Mild carotid vascular calcification Skull: Normal. Negative for fracture or focal lesion. Sinuses/Orbits: No acute finding. Other: None IMPRESSION: 1. No CT evidence for acute intracranial abnormality. 2. Minimal right frontal subcortical white matter hypodensity, nonspecific but probably small vessel ischemic change. Electronically Signed   By: Donavan Foil M.D.   On: 07/24/2020 17:43   MR ANGIO HEAD WO CONTRAST  Result Date: 07/24/2020 CLINICAL DATA:  Stroke, follow-up. EXAM: MRI HEAD WITHOUT CONTRAST MRA HEAD WITHOUT CONTRAST TECHNIQUE: Multiplanar, multiecho pulse sequences of the brain and surrounding structures were obtained without intravenous contrast. Angiographic images of the head were obtained using MRA technique without contrast. COMPARISON:  Noncontrast head CT performed earlier the same day 07/24/2020. FINDINGS: MRI HEAD FINDINGS Brain: Cerebral volume is normal for age. 1.0 x 1.0 x 2.3 cm (AP x TV x CC) focus of restricted diffusion within the left corona radiata/lentiform nucleus consistent with acute/early subacute infarction. Corresponding T2/FLAIR hyperintensity at this site. Mild patchy T2/FLAIR hyperintensity elsewhere within the cerebral white matter is nonspecific, but compatible with chronic small vessel ischemic disease. No evidence of intracranial mass. No chronic intracranial blood  products. No extra-axial fluid collection. No midline shift. Vascular: Reported below. Skull and upper cervical spine: No focal marrow lesion. Sinuses/Orbits: Visualized orbits show no acute finding. Small mucous retention cyst within the posterior left ethmoid air cell. No significant mastoid effusion. MRA HEAD FINDINGS The intracranial internal carotid arteries are patent. The M1 middle cerebral arteries are patent without significant stenosis. No M2 proximal branch occlusion or high-grade proximal stenosis is identified. Atherosclerotic irregularity of the M2 and more distal MCA branch vessels bilaterally. The anterior cerebral arteries are patent. The intracranial vertebral arteries are patent. The basilar artery is patent. The posterior cerebral arteries are patent. Severe stenosis within the distal P2 right posterior cerebral artery (series 1037, image 13). Severe stenosis within the proximal P2 left posterior cerebral artery (series 1037, image 11). Additional high-grade stenoses more distally within the left posterior cerebral artery within the P2 segment and P2/P3 junction (series 1037, image 13). 2 mm superiorly projecting aneurysm arising from a proximal left M2 MCA branch vessel (series 9, images 129 and 130). IMPRESSION: MRI brain: 1. 2.3 cm acute/early subacute infarct within the left corona radiata/lentiform nucleus. 2. Background mild cerebral white matter chronic small vessel ischemic disease. MRA head: 1. No intracranial large vessel occlusion. 2. Intracranial atherosclerotic disease with multifocal stenoses, most notably as follows. 3. Severe stenosis within the distal P2 right posterior cerebral artery. 4. Severe stenosis within  the proximal P2 left posterior cerebral artery. 5. Additional multifocal high-grade stenoses more distally within the left posterior cerebral artery within the P2 segment and P2/P3 junction. 6. 2 mm superiorly projecting aneurysm arising from a proximal left M2 MCA branch  vessel. Electronically Signed   By: Kellie Simmering DO   On: 07/24/2020 19:07   MR BRAIN WO CONTRAST  Result Date: 07/24/2020 CLINICAL DATA:  Stroke, follow-up. EXAM: MRI HEAD WITHOUT CONTRAST MRA HEAD WITHOUT CONTRAST TECHNIQUE: Multiplanar, multiecho pulse sequences of the brain and surrounding structures were obtained without intravenous contrast. Angiographic images of the head were obtained using MRA technique without contrast. COMPARISON:  Noncontrast head CT performed earlier the same day 07/24/2020. FINDINGS: MRI HEAD FINDINGS Brain: Cerebral volume is normal for age. 1.0 x 1.0 x 2.3 cm (AP x TV x CC) focus of restricted diffusion within the left corona radiata/lentiform nucleus consistent with acute/early subacute infarction. Corresponding T2/FLAIR hyperintensity at this site. Mild patchy T2/FLAIR hyperintensity elsewhere within the cerebral white matter is nonspecific, but compatible with chronic small vessel ischemic disease. No evidence of intracranial mass. No chronic intracranial blood products. No extra-axial fluid collection. No midline shift. Vascular: Reported below. Skull and upper cervical spine: No focal marrow lesion. Sinuses/Orbits: Visualized orbits show no acute finding. Small mucous retention cyst within the posterior left ethmoid air cell. No significant mastoid effusion. MRA HEAD FINDINGS The intracranial internal carotid arteries are patent. The M1 middle cerebral arteries are patent without significant stenosis. No M2 proximal branch occlusion or high-grade proximal stenosis is identified. Atherosclerotic irregularity of the M2 and more distal MCA branch vessels bilaterally. The anterior cerebral arteries are patent. The intracranial vertebral arteries are patent. The basilar artery is patent. The posterior cerebral arteries are patent. Severe stenosis within the distal P2 right posterior cerebral artery (series 1037, image 13). Severe stenosis within the proximal P2 left posterior  cerebral artery (series 1037, image 11). Additional high-grade stenoses more distally within the left posterior cerebral artery within the P2 segment and P2/P3 junction (series 1037, image 13). 2 mm superiorly projecting aneurysm arising from a proximal left M2 MCA branch vessel (series 9, images 129 and 130). IMPRESSION: MRI brain: 1. 2.3 cm acute/early subacute infarct within the left corona radiata/lentiform nucleus. 2. Background mild cerebral white matter chronic small vessel ischemic disease. MRA head: 1. No intracranial large vessel occlusion. 2. Intracranial atherosclerotic disease with multifocal stenoses, most notably as follows. 3. Severe stenosis within the distal P2 right posterior cerebral artery. 4. Severe stenosis within the proximal P2 left posterior cerebral artery. 5. Additional multifocal high-grade stenoses more distally within the left posterior cerebral artery within the P2 segment and P2/P3 junction. 6. 2 mm superiorly projecting aneurysm arising from a proximal left M2 MCA branch vessel. Electronically Signed   By: Kellie Simmering DO   On: 07/24/2020 19:07     Scheduled Meds: . pantoprazole (PROTONIX) IV  40 mg Intravenous Q24H   Continuous Infusions:   LOS: 0 days    Time spent: [place], MD [facility] To contact the attending provider between 7A-7P or the covering provider during after hours 7P-7A, please log into the web site [email or web address] and access using universal [place] password for that web site. If you do not have the password, please call the hospital operator.  07/25/2020, 7:28 AM

## 2020-07-25 NOTE — Progress Notes (Signed)
*  PRELIMINARY RESULTS* Echocardiogram 2D Echocardiogram has been performed.  Jeryl Columbia 07/25/2020, 1:02 PM

## 2020-07-25 NOTE — ED Notes (Signed)
PT evaluation and no deficients.

## 2020-07-25 NOTE — Evaluation (Signed)
Physical Therapy Evaluation Patient Details Name: Barbara Steele MRN: [id number] DOB: August 26, 1961 Today's Date: 07/25/2020   History of Present Illness  Barbara Steele is a 59 y.o. female with medical history significant for myasthenia gravis s/p thymectomy (10 years ago and not on medication) who presents to the emergency department due to slurred speech and gait imbalance.  Patient states that she first noted mild right facial droop on Monday (10/18), but this appears to have improved slightly.  She complained of noting weakness in her right fingers (she is left-handed) and mild slurred speech yesterday (10/20) and today, daughter noted that her gait was slightly off balance, so she presents to the emergency department with thoughts that symptoms could be related to her chronic history of myasthenia gravis.  She recently moved to this area from [place], [place], she states that her job was stressful and thinks that it: I have triggered her symptoms.  She denies chest pain, shortness of breath, headache, blurry vision, fever, chills.    Clinical Impression  Patient functioning at baseline for functional mobility and gait.  Plan:  Patient discharged from physical therapy to care of nursing for ambulation daily as tolerated for length of stay.     Follow Up Recommendations No PT follow up    Equipment Recommendations  None recommended by PT    Recommendations for Other Services       Precautions / Restrictions Precautions Precautions: None Restrictions Weight Bearing Restrictions: No      Mobility  Bed Mobility Overal bed mobility: Independent                  Transfers Overall transfer level: Independent                  Ambulation/Gait Ambulation/Gait assistance: Independent;Modified independent (Device/Increase time) Gait Distance (Feet): 100 Feet Assistive device: None Gait Pattern/deviations: WFL(Within Functional Limits) Gait velocity: WNL    General Gait Details: demonstrates good return for ambulation in room/hallways without loss of balance  Stairs            Wheelchair Mobility    Modified Rankin (Stroke Patients Only)       Balance Overall balance assessment: No apparent balance deficits (not formally assessed)                                           Pertinent Vitals/Pain Pain Assessment: No/denies pain    Home Living Family/patient expects to be discharged to:: Private residence Living Arrangements: Children Available Help at Discharge: Available PRN/intermittently Type of Home: House Home Access: Stairs to enter Entrance Stairs-Rails: None Entrance Stairs-Number of Steps: 2-3 Home Layout: One level Home Equipment: None      Prior Function Level of Independence: Independent         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, drives     Hand Dominance   Dominant Hand: Left    Extremity/Trunk Assessment   Upper Extremity Assessment Upper Extremity Assessment: Defer to OT evaluation    Lower Extremity Assessment Lower Extremity Assessment: Overall WFL for tasks assessed    Cervical / Trunk Assessment Cervical / Trunk Assessment: Normal  Communication   Communication: No difficulties  Cognition Arousal/Alertness: Awake/alert Behavior During Therapy: WFL for tasks assessed/performed Overall Cognitive Status: Within Functional Limits for tasks assessed  General Comments      Exercises     Assessment/Plan    PT Assessment Patent does not need any further PT services  PT Problem List         PT Treatment Interventions      PT Goals (Current goals can be found in the Care Plan section)  Acute Rehab PT Goals Patient Stated Goal: Return home with family to assist PT Goal Formulation: With patient/family Time For Goal Achievement: 07/25/20 Potential to Achieve Goals: Good    Frequency     Barriers to  discharge        Co-evaluation               AM-PAC PT "6 Clicks" Mobility  Outcome Measure Help needed turning from your back to your side while in a flat bed without using bedrails?: None Help needed moving from lying on your back to sitting on the side of a flat bed without using bedrails?: None Help needed moving to and from a bed to a chair (including a wheelchair)?: None Help needed standing up from a chair using your arms (e.g., wheelchair or bedside chair)?: None Help needed to walk in hospital room?: None Help needed climbing 3-5 steps with a railing? : None 6 Click Score: 24    End of Session   Activity Tolerance: Patient tolerated treatment well Patient left: in bed;with call bell/phone within reach;with family/visitor present Nurse Communication: Mobility status PT Visit Diagnosis: Unsteadiness on feet (R26.81);Other abnormalities of gait and mobility (R26.89);Muscle weakness (generalized) (M62.81)    Time: 1062-6948 PT Time Calculation (min) (ACUTE ONLY): 20 min   Charges:   PT Evaluation $PT Eval Moderate Complexity: 1 Mod PT Treatments $Therapeutic Activity: 8-22 mins        9:40 AM, 07/25/20 Ocie Bob, MPT Physical Therapist with [facility] [phone] office [phone] mobile phone

## 2020-07-25 NOTE — Progress Notes (Signed)
SLP Cancellation Note  Patient Details Name: Barbara Steele MRN: [id number] DOB: 12-21-1960   Cancelled treatment:       Reason Eval/Treat Not Completed: SLP screened, no needs identified for dysphagia, will complete order for dysphagia at this time. Pt passed [facility] Stroke Swallow screen and denies swallowing difficulty. Pt reports changes in her speech, however, and would benefit from SLE. Will stand by for SLE order  Thank you, Shenika Quint H. Romie Levee, CCC-SLP Speech Language Pathologist    Georgetta Haber 07/25/2020, 12:12 PM

## 2020-07-26 LAB — HEMOGLOBIN A1C
Hgb A1c MFr Bld: 5.2 % (ref 4.8–5.6)
Mean Plasma Glucose: 103 mg/dL

## 2020-07-26 MED ORDER — ASPIRIN EC 81 MG PO TBEC: 81.0000 mg | DELAYED_RELEASE_TABLET | Freq: Every day | ORAL | 2 refills | Status: AC

## 2020-07-26 MED ORDER — ATORVASTATIN CALCIUM 40 MG PO TABS: 40.0000 mg | ORAL_TABLET | Freq: Every day | ORAL | 11 refills | Status: DC

## 2020-07-26 NOTE — Discharge Summary (Signed)
Physician Discharge Summary  Justice Larrick MRN:[id number] DOB: 10/31/1960 DOA: 07/24/2020  PCP: Pcp, No  Admit date: 07/24/2020 Discharge date: 07/26/2020  Time spent: 25 minutes  Recommendations for Outpatient Follow-up:  1. needs aspirin, Lipitor and glycemic control in the outpatient setting Please check A1c lipids in about 3 months Will need outpatient speech therapy input and we will ask case management to help coordinate Improved Discharge Diagnoses:  Principal Problem:   Acute ischemic stroke (HCC) Active Problems:   Hypokalemia   Vomiting   Elevated alpha fetoprotein   Prolonged QT interval   History of myasthenia gravis   Stroke (HCC)   Discharge Condition:  IMPROVED  Diet recommendation: HHEALTHY  Filed Weights   07/24/20 1550  Weight: 77.1 kg    History of present illness:   59-year-old black female Known myasthenia gravis status post thymectomy 10 years ago not currently on any meds, prior mastectomy Presented to [place] emergency room off balance with garbled speech Blood pressure 207/128 potassium 3.4 CT head minimal right frontal subcortical white matter hypodensity nonspecific-MRI head 2.3 cm Neurologist Dr. Lindzen consulted by ED recommending aspirin 81 daily and neurology consult at [place] where she can stay Also found to have bleeding gums which is chronic for her   Hospital Course: CIWA score 1. Acute ischemic 2.3 cm subacute infarct left corona radiata/lentiform a. Still has deficit to some degree with speech all no motor deficit seen resolved--Will need OP SLP input for fuirthe rmanagement b. Negative echo, carotid etc.  c. PT/Ot cleared patient for d/c with no further recs d. Started Statin lipitor this admit continue aspirin 81 mg 2. History of myasthenia gravis X 30 years status post thymectomy in the past, last treated 10 years ago with steroids a. Not on treatment now and do not think that this is a myasthenia flare b. May  require outpatient coordination with neurologist 3. Bleeding gums a. No further concerns monitor only repeat labs daily instead of more frequently 4. Self-reported history of rheumatoid arthritis previously on prednisone a. No symptoms at this stage of stage as she is without symptoms will hold off on lab evaluation b. May need CCP ESR CRP as an outpatient 5. Prolonged QTC a. Keep on telemetry transiently and then monitor    Discharge Exam: Vitals:   07/26/20 0440 07/26/20 0640  BP: (!) 148/78 136/76  Pulse: 89 95  Resp: 18 18  Temp: 98.5 F (36.9 C)   SpO2: 98% 98%    General: eomi ncat no focal deficit  Cardiovascular: s1 s 2no m/r/g Respiratory: clea rno added sound Past pointing on the L side Some speech impediment and tongue protrudes and goes to R side Rest reasonably normalized  Discharge Instructions   Discharge Instructions    Diet - low sodium heart healthy   Complete by: As directed    Discharge instructions   Complete by: As directed    You have had a stroke as we discussed and you will need close attention to your diet, will need to take aspirin, Lipitor and you will also need to keep close attention to your blood sugar control which can be monitored in the outpatient setting My suggestion is that you follow-up with Dr. Doonquah for your underlying myasthenia in addition and I will give you his numbers he can call in a week-please tell the scheduler that this is a HOSPITAL FOLLOW-up for stroke and they can work you in sooner Best of luck I hope you enjoy [place]     Increase activity slowly   Complete by: As directed      Allergies as of 07/26/2020   No Known Allergies     Medication List    TAKE these medications   aspirin EC 81 MG tablet Take 1 tablet (81 mg total) by mouth daily. Swallow whole.   atorvastatin 40 MG tablet Commonly known as: Lipitor Take 1 tablet (40 mg total) by mouth daily.   ferrous sulfate 325 (65 FE) MG tablet Take  325 mg by mouth daily with breakfast.      No Known Allergies  Follow-up Information    Phillips Odor, MD. Schedule an appointment as soon as possible for a visit in 1 week(s).   Specialty: Neurology Contact information: [address] [place] [address] [phone]                The results of significant diagnostics from this hospitalization (including imaging, microbiology, ancillary and laboratory) are listed below for reference.    Significant Diagnostic Studies: CT Head Wo Contrast  Result Date: 07/24/2020 CLINICAL DATA:  Facial droop EXAM: CT HEAD WITHOUT CONTRAST TECHNIQUE: Contiguous axial images were obtained from the base of the skull through the vertex without intravenous contrast. COMPARISON:  None. FINDINGS: Brain: No acute territorial infarction, hemorrhage or intracranial mass. Minimal right frontal subcortical white matter hypodensity. Ventricles nonenlarged. Prominent right parafalcine calcification. Vascular: No hyperdense vessel or unexpected calcification. Mild carotid vascular calcification Skull: Normal. Negative for fracture or focal lesion. Sinuses/Orbits: No acute finding. Other: None IMPRESSION: 1. No CT evidence for acute intracranial abnormality. 2. Minimal right frontal subcortical white matter hypodensity, nonspecific but probably small vessel ischemic change. Electronically Signed   By: Donavan Foil M.D.   On: 07/24/2020 17:43   MR ANGIO HEAD WO CONTRAST  Result Date: 07/24/2020 CLINICAL DATA:  Stroke, follow-up. EXAM: MRI HEAD WITHOUT CONTRAST MRA HEAD WITHOUT CONTRAST TECHNIQUE: Multiplanar, multiecho pulse sequences of the brain and surrounding structures were obtained without intravenous contrast. Angiographic images of the head were obtained using MRA technique without contrast. COMPARISON:  Noncontrast head CT performed earlier the same day 07/24/2020. FINDINGS: MRI HEAD FINDINGS Brain: Cerebral volume is normal for age. 1.0 x 1.0 x  2.3 cm (AP x TV x CC) focus of restricted diffusion within the left corona radiata/lentiform nucleus consistent with acute/early subacute infarction. Corresponding T2/FLAIR hyperintensity at this site. Mild patchy T2/FLAIR hyperintensity elsewhere within the cerebral white matter is nonspecific, but compatible with chronic small vessel ischemic disease. No evidence of intracranial mass. No chronic intracranial blood products. No extra-axial fluid collection. No midline shift. Vascular: Reported below. Skull and upper cervical spine: No focal marrow lesion. Sinuses/Orbits: Visualized orbits show no acute finding. Small mucous retention cyst within the posterior left ethmoid air cell. No significant mastoid effusion. MRA HEAD FINDINGS The intracranial internal carotid arteries are patent. The M1 middle cerebral arteries are patent without significant stenosis. No M2 proximal branch occlusion or high-grade proximal stenosis is identified. Atherosclerotic irregularity of the M2 and more distal MCA branch vessels bilaterally. The anterior cerebral arteries are patent. The intracranial vertebral arteries are patent. The basilar artery is patent. The posterior cerebral arteries are patent. Severe stenosis within the distal P2 right posterior cerebral artery (series 1037, image 13). Severe stenosis within the proximal P2 left posterior cerebral artery (series 1037, image 11). Additional high-grade stenoses more distally within the left posterior cerebral artery within the P2 segment and P2/P3 junction (series 1037, image 13). 2 mm superiorly projecting aneurysm arising  from a proximal left M2 MCA branch vessel (series 9, images 129 and 130). IMPRESSION: MRI brain: 1. 2.3 cm acute/early subacute infarct within the left corona radiata/lentiform nucleus. 2. Background mild cerebral white matter chronic small vessel ischemic disease. MRA head: 1. No intracranial large vessel occlusion. 2. Intracranial atherosclerotic disease  with multifocal stenoses, most notably as follows. 3. Severe stenosis within the distal P2 right posterior cerebral artery. 4. Severe stenosis within the proximal P2 left posterior cerebral artery. 5. Additional multifocal high-grade stenoses more distally within the left posterior cerebral artery within the P2 segment and P2/P3 junction. 6. 2 mm superiorly projecting aneurysm arising from a proximal left M2 MCA branch vessel. Electronically Signed   By: Kellie Simmering DO   On: 07/24/2020 19:07   MR BRAIN WO CONTRAST  Result Date: 07/24/2020 CLINICAL DATA:  Stroke, follow-up. EXAM: MRI HEAD WITHOUT CONTRAST MRA HEAD WITHOUT CONTRAST TECHNIQUE: Multiplanar, multiecho pulse sequences of the brain and surrounding structures were obtained without intravenous contrast. Angiographic images of the head were obtained using MRA technique without contrast. COMPARISON:  Noncontrast head CT performed earlier the same day 07/24/2020. FINDINGS: MRI HEAD FINDINGS Brain: Cerebral volume is normal for age. 1.0 x 1.0 x 2.3 cm (AP x TV x CC) focus of restricted diffusion within the left corona radiata/lentiform nucleus consistent with acute/early subacute infarction. Corresponding T2/FLAIR hyperintensity at this site. Mild patchy T2/FLAIR hyperintensity elsewhere within the cerebral white matter is nonspecific, but compatible with chronic small vessel ischemic disease. No evidence of intracranial mass. No chronic intracranial blood products. No extra-axial fluid collection. No midline shift. Vascular: Reported below. Skull and upper cervical spine: No focal marrow lesion. Sinuses/Orbits: Visualized orbits show no acute finding. Small mucous retention cyst within the posterior left ethmoid air cell. No significant mastoid effusion. MRA HEAD FINDINGS The intracranial internal carotid arteries are patent. The M1 middle cerebral arteries are patent without significant stenosis. No M2 proximal branch occlusion or high-grade proximal  stenosis is identified. Atherosclerotic irregularity of the M2 and more distal MCA branch vessels bilaterally. The anterior cerebral arteries are patent. The intracranial vertebral arteries are patent. The basilar artery is patent. The posterior cerebral arteries are patent. Severe stenosis within the distal P2 right posterior cerebral artery (series 1037, image 13). Severe stenosis within the proximal P2 left posterior cerebral artery (series 1037, image 11). Additional high-grade stenoses more distally within the left posterior cerebral artery within the P2 segment and P2/P3 junction (series 1037, image 13). 2 mm superiorly projecting aneurysm arising from a proximal left M2 MCA branch vessel (series 9, images 129 and 130). IMPRESSION: MRI brain: 1. 2.3 cm acute/early subacute infarct within the left corona radiata/lentiform nucleus. 2. Background mild cerebral white matter chronic small vessel ischemic disease. MRA head: 1. No intracranial large vessel occlusion. 2. Intracranial atherosclerotic disease with multifocal stenoses, most notably as follows. 3. Severe stenosis within the distal P2 right posterior cerebral artery. 4. Severe stenosis within the proximal P2 left posterior cerebral artery. 5. Additional multifocal high-grade stenoses more distally within the left posterior cerebral artery within the P2 segment and P2/P3 junction. 6. 2 mm superiorly projecting aneurysm arising from a proximal left M2 MCA branch vessel. Electronically Signed   By: Kellie Simmering DO   On: 07/24/2020 19:07   ECHOCARDIOGRAM COMPLETE  Result Date: 07/25/2020    ECHOCARDIOGRAM REPORT   Patient Name:   TERRIANN DIFONZO Date of Exam: 07/25/2020 Medical Rec #:  [id number]       Height:  67.0 in Accession #:    [id number]      Weight:       170.0 lb Date of Birth:  12/13/1960        BSA:          1.887 m Patient Age:    59 years        BP:           160/81 mmHg Patient Gender: F               HR:           101 bpm. Exam  Location:  [facility] Procedure: 2D Echo Indications:    Stroke 434.91 / I163.9  History:        Patient has no prior history of Echocardiogram examinations.                 Risk Factors:Non-Smoker. Acute Ischemic stoke, Elevated alpha                 fetoprotein                 Prolonged QT interval.  Sonographer:    Johanna Elliott RDCS (AE) Referring Phys: [id number] OLADAPO ADEFESO  Sonographer Comments: Image acquisition challenging due to breast implants. IMPRESSIONS  1. Left ventricular ejection fraction, by estimation, is 65 to 70%. The left ventricle has normal function. The left ventricle has no regional wall motion abnormalities. There is mild left ventricular hypertrophy. Left ventricular diastolic parameters are consistent with Grade I diastolic dysfunction (impaired relaxation).  2. Right ventricular systolic function is normal. The right ventricular size is normal.  3. The mitral valve is normal in structure. No evidence of mitral valve regurgitation. No evidence of mitral stenosis.  4. The aortic valve was not well visualized. Aortic valve regurgitation is not visualized. No aortic stenosis is present.  5. The inferior vena cava is normal in size with greater than 50% respiratory variability, suggesting right atrial pressure of 3 mmHg. FINDINGS  Left Ventricle: Left ventricular ejection fraction, by estimation, is 65 to 70%. The left ventricle has normal function. The left ventricle has no regional wall motion abnormalities. The left ventricular internal cavity size was normal in size. There is  mild left ventricular hypertrophy. Left ventricular diastolic parameters are consistent with Grade I diastolic dysfunction (impaired relaxation). Normal left ventricular filling pressure. Right Ventricle: The right ventricular size is normal. No increase in right ventricular wall thickness. Right ventricular systolic function is normal. Left Atrium: Left atrial size was normal in size. Right Atrium: Right atrial  size was normal in size. Pericardium: There is no evidence of pericardial effusion. Mitral Valve: The mitral valve is normal in structure. No evidence of mitral valve regurgitation. No evidence of mitral valve stenosis. Tricuspid Valve: The tricuspid valve is normal in structure. Tricuspid valve regurgitation is not demonstrated. No evidence of tricuspid stenosis. Aortic Valve: The aortic valve was not well visualized. Aortic valve regurgitation is not visualized. No aortic stenosis is present. Aortic valve mean gradient measures 2.8 mmHg. Aortic valve peak gradient measures 6.0 mmHg. Aortic valve area, by VTI measures 2.58 cm. Pulmonic Valve: The pulmonic valve was not well visualized. Pulmonic valve regurgitation is not visualized. No evidence of pulmonic stenosis. Aorta: The aortic root is normal in size and structure. Pulmonary Artery: Indeterminant PASP, inadequate TR jet. Venous: The inferior vena cava is normal in size with greater than 50% respiratory variability, suggesting right atrial pressure of 3 mmHg. IAS/Shunts: No   atrial level shunt detected by color flow Doppler.  LEFT VENTRICLE PLAX 2D LVIDd:         3.72 cm  Diastology LVIDs:         2.22 cm  LV e' medial:    6.09 cm/s LV PW:         1.04 cm  LV E/e' medial:  9.5 LV IVS:        1.01 cm  LV e' lateral:   4.79 cm/s LVOT diam:     2.10 cm  LV E/e' lateral: 12.1 LV SV:         54 LV SV Index:   29 LVOT Area:     3.46 cm  RIGHT VENTRICLE RV S prime:     20.00 cm/s TAPSE (M-mode): 2.9 cm LEFT ATRIUM             Index      RIGHT ATRIUM          Index LA diam:        2.90 cm 1.54 cm/m RA Area:     8.50 cm LA Vol (A2C):   15.4 ml 8.16 ml/m RA Volume:   18.00 ml 9.54 ml/m LA Vol (A4C):   18.4 ml 9.75 ml/m LA Biplane Vol: 17.5 ml 9.27 ml/m  AORTIC VALVE AV Area (Vmax):    2.23 cm AV Area (Vmean):   2.17 cm AV Area (VTI):     2.58 cm AV Vmax:           122.17 cm/s AV Vmean:          76.089 cm/s AV VTI:            0.209 m AV Peak Grad:      6.0 mmHg  AV Mean Grad:      2.8 mmHg LVOT Vmax:         78.76 cm/s LVOT Vmean:        47.604 cm/s LVOT VTI:          0.156 m LVOT/AV VTI ratio: 0.75  AORTA Ao Root diam: 2.10 cm MITRAL VALVE MV Area (PHT): 3.27 cm    SHUNTS MV Decel Time: 232 msec    Systemic VTI:  0.16 m MV E velocity: 58.00 cm/s  Systemic Diam: 2.10 cm MV A velocity: 76.00 cm/s MV E/A ratio:  0.76 Jonathan Branch MD Electronically signed by Jonathan Branch MD Signature Date/Time: 07/25/2020/4:13:34 PM    Final     Microbiology: Recent Results (from the past 240 hour(s))  Respiratory Panel by RT PCR (Flu A&B, Covid) - Nasopharyngeal Swab     Status: None   Collection Time: 07/24/20  7:43 PM   Specimen: Nasopharyngeal Swab  Result Value Ref Range Status   SARS Coronavirus 2 by RT PCR NEGATIVE NEGATIVE Final    Comment: (NOTE) SARS-CoV-2 target nucleic acids are NOT DETECTED.  The SARS-CoV-2 RNA is generally detectable in upper respiratoy specimens during the acute phase of infection. The lowest concentration of SARS-CoV-2 viral copies this assay can detect is 131 copies/mL. A negative result does not preclude SARS-Cov-2 infection and should not be used as the sole basis for treatment or other patient management decisions. A negative result may occur with  improper specimen collection/handling, submission of specimen other than nasopharyngeal swab, presence of viral mutation(s) within the areas targeted by this assay, and inadequate number of viral copies (<131 copies/mL). A negative result must be combined with clinical observations, patient history, and epidemiological information. The expected   result is Negative.  Fact Sheet for Patients:  https://www.fda.gov/media/142436/download  Fact Sheet for Healthcare Providers:  https://www.fda.gov/media/142435/download  This test is no t yet approved or cleared by the [place] FDA and  has been authorized for detection and/or diagnosis of SARS-CoV-2 by FDA under an Emergency  Use Authorization (EUA). This EUA will remain  in effect (meaning this test can be used) for the duration of the COVID-19 declaration under Section 564(b)(1) of the Act, 21 U.S.C. section 360bbb-3(b)(1), unless the authorization is terminated or revoked sooner.     Influenza A by PCR NEGATIVE NEGATIVE Final   Influenza B by PCR NEGATIVE NEGATIVE Final    Comment: (NOTE) The Xpert Xpress SARS-CoV-2/FLU/RSV assay is intended as an aid in  the diagnosis of influenza from Nasopharyngeal swab specimens and  should not be used as a sole basis for treatment. Nasal washings and  aspirates are unacceptable for Xpert Xpress SARS-CoV-2/FLU/RSV  testing.  Fact Sheet for Patients: https://www.fda.gov/media/142436/download  Fact Sheet for Healthcare Providers: https://www.fda.gov/media/142435/download  This test is not yet approved or cleared by the [place] FDA and  has been authorized for detection and/or diagnosis of SARS-CoV-2 by  FDA under an Emergency Use Authorization (EUA). This EUA will remain  in effect (meaning this test can be used) for the duration of the  Covid-19 declaration under Section 564(b)(1) of the Act, 21  U.S.C. section 360bbb-3(b)(1), unless the authorization is  terminated or revoked. Performed at [facility], [address] [place], [place] [address]      Labs: Basic Metabolic Panel: Recent Labs  Lab 07/24/20 1648  NA 140  K 3.4*  CL 105  CO2 25  GLUCOSE 83  BUN 12  CREATININE 0.84  CALCIUM 9.1   Liver Function Tests: Recent Labs  Lab 07/24/20 1648  AST 18  ALT 19  ALKPHOS 130*  BILITOT 0.8  PROT 8.3*  ALBUMIN 3.8   No results for input(s): LIPASE, AMYLASE in the last 168 hours. No results for input(s): AMMONIA in the last 168 hours. CBC: Recent Labs  Lab 07/24/20 1648  WBC 5.9  NEUTROABS 3.7  HGB 13.8  HCT 43.2  MCV 82.9  PLT 350   Cardiac Enzymes: No results for input(s): CKTOTAL, CKMB, CKMBINDEX, TROPONINI in the last 168  hours. BNP: BNP (last 3 results) No results for input(s): BNP in the last 8760 hours.  ProBNP (last 3 results) No results for input(s): PROBNP in the last 8760 hours.  CBG: No results for input(s): GLUCAP in the last 168 hours.     Signed:  Jai-Gurmukh Samtani MD   [facility] 07/26/2020, 11:01 AM   

## 2020-07-26 NOTE — TOC Transition Note (Signed)
Transition of Care [facility]) - CM/SW Discharge Note   Patient Details  Name: Barbara Steele MRN: [id number] Date of Birth: [birth year]  [facility] CM/SW Contact:  Barry Brunner, LCSW Phone Number: 07/26/2020, 11:17 AM   Clinical Narrative:    CSW notification of patient's need for OP SLP. CSW placed referral and order with [facility]. Patient is to follow up to schedule appointment for services. CSW not able to schedule appointment due to weekend discharge and operating hours of facility. TOC signing off.      Barriers to Discharge: ED PCP establishment   Patient Goals and CMS Choice     Choice offered to / list presented to : NA  Discharge Placement                       Discharge Plan and Services                                     Social Determinants of Health (SDOH) Interventions     Readmission Risk Interventions No flowsheet data found.

## 2020-07-31 DIAGNOSIS — E785 Hyperlipidemia, unspecified: Secondary | ICD-10-CM | POA: Diagnosis not present

## 2020-07-31 DIAGNOSIS — I10 Essential (primary) hypertension: Secondary | ICD-10-CM | POA: Diagnosis not present

## 2020-07-31 DIAGNOSIS — Z8673 Personal history of transient ischemic attack (TIA), and cerebral infarction without residual deficits: Secondary | ICD-10-CM | POA: Diagnosis not present

## 2020-07-31 DIAGNOSIS — G7000 Myasthenia gravis without (acute) exacerbation: Secondary | ICD-10-CM | POA: Diagnosis not present

## 2020-08-06 DIAGNOSIS — M069 Rheumatoid arthritis, unspecified: Secondary | ICD-10-CM | POA: Diagnosis not present

## 2020-08-06 DIAGNOSIS — I10 Essential (primary) hypertension: Secondary | ICD-10-CM | POA: Diagnosis not present

## 2020-08-06 DIAGNOSIS — I639 Cerebral infarction, unspecified: Secondary | ICD-10-CM | POA: Diagnosis not present

## 2020-08-06 DIAGNOSIS — Z23 Encounter for immunization: Secondary | ICD-10-CM | POA: Diagnosis not present

## 2020-08-06 DIAGNOSIS — Z0001 Encounter for general adult medical examination with abnormal findings: Secondary | ICD-10-CM | POA: Diagnosis not present

## 2020-08-07 ENCOUNTER — Ambulatory Visit (HOSPITAL_COMMUNITY): Payer: BC Managed Care – PPO | Attending: Gerontology | Admitting: Speech Pathology

## 2020-08-07 ENCOUNTER — Encounter (HOSPITAL_COMMUNITY): Payer: Self-pay | Admitting: Speech Pathology

## 2020-08-07 ENCOUNTER — Other Ambulatory Visit: Payer: Self-pay

## 2020-08-07 DIAGNOSIS — R41841 Cognitive communication deficit: Secondary | ICD-10-CM | POA: Diagnosis not present

## 2020-08-07 DIAGNOSIS — R471 Dysarthria and anarthria: Secondary | ICD-10-CM | POA: Diagnosis not present

## 2020-08-07 NOTE — Therapy (Signed)
[facility] [address] [place], [place], [address] Phone: [phone]   Fax:  [phone]  Speech Language Pathology Evaluation  Patient Details  Name: Barbara Steele MRN: [id number] Date of Birth: [birth year] Referring Provider (SLP): Melody Comas, NP   Encounter Date: 08/07/2020   End of Session - 08/07/20 1225    Visit Number 1    Number of Visits 9    Date for SLP Re-Evaluation 09/08/20    Authorization Type BCBS PPO   $500.00 ded/ $196.66 met10% co-insuranceoop max $1,500.00/ $200.41 met30 visit limit/ 0 usedno auth req   SLP Start Time 1040    SLP Stop Time  1135    SLP Time Calculation (min) 55 min    Activity Tolerance Patient tolerated treatment well           Past Medical History:  Diagnosis Date  . Arthritis   . Myasthenia gravis [facility])     Past Surgical History:  Procedure Laterality Date  . MASTECTOMY      There were no vitals filed for this visit.   Subjective Assessment - 08/07/20 1110    Subjective "My speech is still slurred, I am drooling on the right side, and I am having a hard time finding my words."    Special Tests RBANS (Repeatable Battery for the Assessment of Neuropsychological Status)    Currently in Pain? No/denies              SLP Evaluation [facility] - 08/07/20 1110      SLP Visit Information   SLP Received On 08/07/20    Referring Provider (SLP) Melody Comas, NP    Onset Date 07/24/20    Medical Diagnosis CVA      Subjective   Subjective "My speech is still slurred, I am drooling on the right side, and I am having a hard time finding my words."    Patient/Family Stated Goal "To be articulate again."      General Information   HPI Barbara Steele is a 59 y.o. female with medical history significant for myasthenia gravis s/p thymectomy (10 years ago and not on medication) who presented to the emergency department due to slurred speech and gait imbalance on 07/24/20.  Patient  states that she first noted mild right facial droop on Monday (10/18), but this appears to have improved slightly. MRI showed: 2.3 cm acute/early subacute infarct within the left corona radiata/lentiform nucleus. Pt had SLE during her acute stay with recommendation for outpatientn SLP follow up for cognitive linguistic changes. 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     Behavioral/Cognition Alert and cooperative    Mobility Status ambulatory      Balance Screen   Has the patient fallen in the past 6 months Yes    How many times? 1    Has the patient had a decrease in activity level because of a fear of falling?  No    Is the patient reluctant to leave their home because of a fear of falling?  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     Pain Assessment  Pain Assessment No/denies pain      Cognition   Overall Cognitive Status Impaired/Different from baseline    Area of Impairment Memory    Memory Impaired    Memory Impairment Decreased short term memory    Decreased Short Term Memory Verbal complex    Awareness Appears intact    Problem Solving Appears intact      Auditory Comprehension   Overall Auditory Comprehension Appears within functional limits for tasks assessed    Yes/No Questions Within Functional Limits    Commands Within Functional Limits    Conversation Complex    Interfering Components Working Engineer, materials Within Function Limits      Reading Comprehension   Reading Status Within funtional limits      Expression   Primary Mode of Expression Verbal      Verbal Expression   Overall Verbal Expression Impaired    Initiation No impairment    Automatic  Speech Name;Social Response    Level of Generative/Spontaneous Verbalization Conversation    Repetition No impairment    Naming Impairment    Responsive 76-100% accurate    Confrontation 75-100% accurate    Convergent 75-100% accurate    Divergent 75-100% accurate    Pragmatics No impairment    Interfering Components Speech intelligibility    Non-Verbal Means of Communication Not applicable      Written Expression   Dominant Hand Left    Written Expression Not tested      Oral Motor/Sensory Function   Overall Oral Motor/Sensory Function Impaired    Labial ROM Reduced right    Labial Symmetry Abnormal symmetry right    Labial Strength Reduced    Labial Coordination WFL    Lingual ROM Reduced right    Lingual Symmetry Abnormal symmetry right   lingual deviation to the right   Lingual Strength Reduced    Lingual Coordination Reduced    Facial ROM Within Functional Limits    Facial Sensation Within Functional Limits    Facial Coordination Other (comment)    Velum Within Functional Limits    Mandible Within Functional Limits      Motor Speech   Overall Motor Speech Impaired    Respiration Within functional limits    Phonation Normal    Resonance Within functional limits    Articulation Impaired    Level of Impairment Word    Intelligibility Intelligibility reduced    Word 75-100% accurate    Phrase 75-100% accurate    Sentence 75-100% accurate    Conversation 75-100% accurate    Motor Planning Witnin functional limits    Motor Speech Errors Aware    Effective Techniques Slow rate;Over-articulate;Pause    Phonation WFL      Standardized Assessments   Standardized Assessments  --   RBANS             SLP Education - 08/07/20 1224    Education Details Plan for SLP therapy 2x/week for 4 weeks to address dysarthria and cognitive linguistic changes    Person(s) Educated Patient    Methods Explanation;Handout    Comprehension Verbalized understanding;Need further  instruction            SLP Short Term Goals - 08/07/20 1238      SLP SHORT TERM GOAL #1   Title Pt will implement memory strategies in functional therapy activities with 90% acc with min cues.    Baseline 78%    Time 4  Period Weeks    Status New    Target Date 09/08/20      SLP SHORT TERM GOAL #2   Title Pt will implement word-finding strategies with 90% accuracy when unable to verbalize desired word in conversation/functional tasks with min assist.    Baseline 80%    Time 4    Period Weeks    Status New    Target Date 09/08/20      SLP SHORT TERM GOAL #3   Title Pt will implement speech intelligibility strategies during conversational speech (decrease rate, over articulate, increase vocal intensity,self correct errors, self monitoring, checking to see if listener understands) with 90% effectiveness with min assist.    Baseline 80% acc    Time 4    Period Weeks    Status New    Target Date 09/08/20            SLP Long Term Goals - 08/07/20 1228      SLP LONG TERM GOAL #1   Title Pt will increase speech intelligibility for conversations to [facility] with use of strategies as needed.    Baseline ~80% intelligible in 10 minute convesation    Time 1    Period Months    Status New    Target Date 09/08/20      SLP LONG TERM GOAL #2   Title Pt will increase cognitive linguistic skills (memory and word finding) to [facility] with use of strategies as needed.    Baseline Min impairment    Time 1    Period Months    Status New    Target Date 09/08/20            Plan - 08/07/20 1226    Clinical Impression Statement Pt presents with mild cognitive linguistic deficits and mild dysarthria negatively impacting independence and naturalness with communication. She was given the RBANS (Repeatable Battery for the Assessment of Neuropsychological Status) and achieved the following index scores: Immediate Memory 87, 20th%, Visuospatial/Constructional 92, 30th%, Language 99, 45th%,  Attention 112, 80th%, and Delayed Memory 105, 63rd%. She demonstrates difficulty in short term memory, word finding in conversation, and reduced speech intelligibility and naturalness of speech at the sentence level. Pt will benefit from skilled SLP in order to address the above impairments, maximize independence, and decrease burden of care. Pt is highly motivated to address her deficits and return to work.   Speech Therapy Frequency 2x / week    Duration 4 weeks    Treatment/Interventions Oral motor exercises;Compensatory strategies;Patient/family education;Functional tasks;Cueing hierarchy;Cognitive reorganization;SLP instruction and feedback;Language facilitation    Potential to Achieve Goals Good    SLP Home Exercise Plan Pt will complete HEP as assigned to facilitate carryover of treatment strategies and techniques in home environment with written cues    Consulted and Agree with Plan of Care Patient           Patient will benefit from skilled therapeutic intervention in order to improve the following deficits and impairments:   Cognitive communication deficit  Dysarthria and anarthria    Problem List Patient Active Problem List   Diagnosis Date Noted  . Stroke (HCC) 07/25/2020  . Acute ischemic stroke (HCC) 07/24/2020  . Hypokalemia 07/24/2020  . Vomiting 07/24/2020  . Elevated alpha fetoprotein 07/24/2020  . Prolonged QT interval 07/24/2020  . History of myasthenia gravis 07/24/2020   Thank you,  Havery Moros, CCC-SLP [phone]  Barbara Steele 08/07/2020, 12:45 PM   [facility] [address]  [place], [place], [address] Phone: [phone]   Fax:  [phone]  Name: Barbara Steele MRN: [id number] Date of Birth: 1960/12/12

## 2020-08-11 ENCOUNTER — Other Ambulatory Visit: Payer: Self-pay

## 2020-08-11 ENCOUNTER — Encounter (HOSPITAL_COMMUNITY): Payer: Self-pay | Admitting: Speech Pathology

## 2020-08-11 ENCOUNTER — Ambulatory Visit (HOSPITAL_COMMUNITY): Payer: BC Managed Care – PPO | Admitting: Speech Pathology

## 2020-08-11 DIAGNOSIS — R471 Dysarthria and anarthria: Secondary | ICD-10-CM

## 2020-08-11 DIAGNOSIS — R41841 Cognitive communication deficit: Secondary | ICD-10-CM | POA: Diagnosis not present

## 2020-08-11 NOTE — Therapy (Signed)
[facility] [address] [place], [place], [address] Phone: [phone]   Fax:  [phone]  Speech Language Pathology Treatment  Patient Details  Name: Barbara Steele MRN: [id number] Date of Birth: [birth year] Referring Provider (SLP): Melody Comas, NP   Encounter Date: 08/11/2020   End of Session - 08/11/20 1100    Visit Number 2    Number of Visits 9    Date for SLP Re-Evaluation 09/08/20    Authorization Type [employer]BS PPO   $500.00 ded/ $196.66 met10% co-insuranceoop max $1,500.00/ $200.41 met30 visit limit/ 0 usedno auth req   SLP Start Time 1030    SLP Stop Time  1115    SLP Time Calculation (min) 45 min    Activity Tolerance Patient tolerated treatment well           Past Medical History:  Diagnosis Date  . Arthritis   . Myasthenia gravis [facility])     Past Surgical History:  Procedure Laterality Date  . MASTECTOMY      There were no vitals filed for this visit.   Subjective Assessment - 08/11/20 1048    Subjective "I woke up yesterday and my speech seemed normal."    Currently in Pain? No/denies                 ADULT SLP TREATMENT - 08/11/20 1048      General Information   Behavior/Cognition Alert;Cooperative;Pleasant mood    Patient Positioning Upright in chair    Oral care provided N/A    HPI Barbara Steele is a 59 y.o. female with medical history significant for myasthenia gravis s/p thymectomy (10 years ago and not on medication) who presented to the emergency department due to slurred speech and gait imbalance on 07/24/20.  Patient states that she first noted mild right facial droop on Monday (10/18), but this appears to have improved slightly. MRI showed: 2.3 cm acute/early subacute infarct within the left corona radiata/lentiform nucleus. Pt had SLE during her acute stay with recommendation for outpatientn SLP follow up for cognitive linguistic changes. 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       Treatment Provided   Treatment provided Cognitive-Linquistic      Pain Assessment   Pain Assessment No/denies pain      Cognitive-Linquistic Treatment   Treatment focused on Cognition;Dysarthria;Patient/family/caregiver education;Aphasia    Skilled Treatment speech intelligibility strategies in conversation, error awareness, self evaluation, memory strategies      Assessment / Recommendations / Plan   Plan Continue with current plan of care      Progression Toward Goals   Progression toward goals Progressing toward goals              SLP Short Term Goals - 08/11/20 1102      SLP SHORT TERM GOAL #1   Title Pt will implement memory strategies in functional therapy activities with 90% acc with min cues.    Baseline 78%    Time 4    Period Weeks    Status On-going    Target Date 09/08/20      SLP SHORT TERM GOAL #2   Title Pt will implement word-finding strategies with 90% accuracy when unable to verbalize desired word in conversation/functional tasks with min assist.    Baseline 80%    Time 4    Period Weeks    Status On-going    Target Date 09/08/20  SLP SHORT TERM GOAL #3   Title Pt will implement speech intelligibility strategies during conversational speech (decrease rate, over articulate, increase vocal intensity,self correct errors, self monitoring, checking to see if listener understands) with 90% effectiveness with min assist.    Baseline 80% acc    Time 4    Period Weeks    Status On-going    Target Date 09/08/20            SLP Long Term Goals - 08/11/20 1102      SLP LONG TERM GOAL #1   Title Pt will increase speech intelligibility for conversations to [facility] with use of strategies as needed.    Baseline ~80% intelligible in 10 minute convesation    Time 1    Period Months    Status On-going      SLP LONG TERM GOAL #2   Title Pt will increase cognitive linguistic skills (memory and word finding) to [facility] with use of  strategies as needed.    Baseline Min impairment    Time 1    Period Months    Status On-going            Plan - 08/11/20 1102    Clinical Impression Statement Pt reports that he speech "felt normal" on Saturday morning, but became less intelligible later in the day. SLP explained that this is normal for recovery, however it should also be noted that Pt has a history of MG. She identified the following strategies to help with her memory: organizing her environment, having a memory place, keeping items to take out by the front door, using post-it notes, don't procrastinate, self talk, and sticking to a routine. She says that she already did these things before her stroke. She did not remember to bring in the form she uses for work today, so we discussed using a post it note to remind her and refer back in the evenings in preparation for the following day. She completed word finding activities (describing without naming, synonyms) with 100% acc with min cues to use synonyms when restricted from using certain words. SLP requested clarification for intelligibility during conversation, 4x in 10 minute period. She implemented speech intelligibility strategies during oral reading of tongue twisters with 95% acc. Plan to use for work template next session.   Speech Therapy Frequency 2x / week    Duration 4 weeks    Treatment/Interventions Oral motor exercises;Compensatory strategies;Patient/family education;Functional tasks;Cueing hierarchy;Cognitive reorganization;SLP instruction and feedback;Language facilitation    Potential to Achieve Goals Good    SLP Home Exercise Plan Pt will complete HEP as assigned to facilitate carryover of treatment strategies and techniques in home environment with written cues    Consulted and Agree with Plan of Care Patient           Patient will benefit from skilled therapeutic intervention in order to improve the following deficits and impairments:   Cognitive  communication deficit  Dysarthria and anarthria    Problem List Patient Active Problem List   Diagnosis Date Noted  . Stroke (HCC) 07/25/2020  . Acute ischemic stroke (HCC) 07/24/2020  . Hypokalemia 07/24/2020  . Vomiting 07/24/2020  . Elevated alpha fetoprotein 07/24/2020  . Prolonged QT interval 07/24/2020  . History of myasthenia gravis 07/24/2020   Thank you,  Havery Moros, CCC-SLP [phone]  [facility] 08/11/2020, 11:26 AM  [facility] [address] [place], [place], [address] Phone: [phone]   Fax:  [phone]   Name: Temperance  Rodger MRN: [id number] Date of Birth: [birth year]

## 2020-08-13 ENCOUNTER — Other Ambulatory Visit: Payer: Self-pay

## 2020-08-13 ENCOUNTER — Ambulatory Visit (HOSPITAL_COMMUNITY): Payer: BC Managed Care – PPO | Admitting: Speech Pathology

## 2020-08-13 ENCOUNTER — Encounter (HOSPITAL_COMMUNITY): Payer: Self-pay | Admitting: Speech Pathology

## 2020-08-13 DIAGNOSIS — R41841 Cognitive communication deficit: Secondary | ICD-10-CM

## 2020-08-13 DIAGNOSIS — R471 Dysarthria and anarthria: Secondary | ICD-10-CM

## 2020-08-13 NOTE — Therapy (Signed)
[facility] [address] [place], [place], [address] Phone: [phone]   Fax:  [phone]  Speech Language Pathology Treatment  Patient Details  Name: Barbara Steele MRN: [id number] Date of Birth: [birth year] Referring Provider (SLP): Melody Comas, NP   Encounter Date: 08/13/2020   End of Session - 08/13/20 1115    Visit Number 3    Number of Visits 9    Date for SLP Re-Evaluation 09/08/20    Authorization Type [employer] PPO   $500.00 ded/ $196.66 met10% co-insuranceoop max $1,500.00/ $200.41 met30 visit limit/ 0 usedno auth req   SLP Start Time 1030    SLP Stop Time  1115    SLP Time Calculation (min) 45 min    Activity Tolerance Patient tolerated treatment well           Past Medical History:  Diagnosis Date  . Arthritis   . Myasthenia gravis [facility])     Past Surgical History:  Procedure Laterality Date  . MASTECTOMY      There were no vitals filed for this visit.   Subjective Assessment - 08/13/20 1107    Subjective "I do wonder if it is Myasthenia Gravis."    Currently in Pain? No/denies              ADULT SLP TREATMENT - 08/13/20 1114      General Information   Behavior/Cognition Alert;Cooperative;Pleasant mood    Patient Positioning Upright in chair    Oral care provided N/A    HPI Barbara Steele is a 59 y.o. female with medical history significant for myasthenia gravis s/p thymectomy (10 years ago and not on medication) who presented to the emergency department due to slurred speech and gait imbalance on 07/24/20.  Patient states that she first noted mild right facial droop on Monday (10/18), but this appears to have improved slightly. MRI showed: 2.3 cm acute/early subacute infarct within the left corona radiata/lentiform nucleus. Pt had SLE during her acute stay with recommendation for outpatientn SLP follow up for cognitive linguistic changes. 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       Treatment Provided   Treatment provided Cognitive-Linquistic      Pain Assessment   Pain Assessment No/denies pain      Cognitive-Linquistic Treatment   Treatment focused on Cognition;Dysarthria;Patient/family/caregiver education;Aphasia    Skilled Treatment speech intelligibility strategies in conversation, error awareness, self evaluation, memory strategies      Assessment / Recommendations / Plan   Plan Continue with current plan of care      Progression Toward Goals   Progression toward goals Progressing toward goals            SLP Education - 08/13/20 1115    Education Details Provided planning/deduction activity for homework    Person(s) Educated Patient    Methods Explanation;Handout    Comprehension Verbalized understanding            SLP Short Term Goals - 08/13/20 1116      SLP SHORT TERM GOAL #1   Title Pt will implement memory strategies in functional therapy activities with 90% acc with min cues.    Baseline 78%    Time 4    Period Weeks    Status On-going    Target Date 09/08/20      SLP SHORT TERM GOAL #2   Title Pt will implement word-finding strategies with 90% accuracy when unable to verbalize  desired word in conversation/functional tasks with min assist.    Baseline 80%    Time 4    Period Weeks    Status On-going    Target Date 09/08/20      SLP SHORT TERM GOAL #3   Title Pt will implement speech intelligibility strategies during conversational speech (decrease rate, over articulate, increase vocal intensity,self correct errors, self monitoring, checking to see if listener understands) with 90% effectiveness with min assist.    Baseline 80% acc    Time 4    Period Weeks    Status On-going    Target Date 09/08/20            SLP Long Term Goals - 08/13/20 1116      SLP LONG TERM GOAL #1   Title Pt will increase speech intelligibility for conversations to [facility] with use of strategies as needed.    Baseline ~80%  intelligible in 10 minute convesation    Time 1    Period Months    Status On-going      SLP LONG TERM GOAL #2   Title Pt will increase cognitive linguistic skills (memory and word finding) to [facility] with use of strategies as needed.    Baseline Min impairment    Time 1    Period Months    Status On-going            Plan - 08/13/20 1116    Clinical Impression Statement Pt completed HEP (synonyms and antonyms) with 100% acc, however she said she had difficulty using more complex words. We discussed whether her speech changes were more due to the stroke or possibly MG. Her stroke was in the left corona radiata and she does present with right facial weakness and right hand weakness (Pt is left handed). She indicates that her speech is drastically worse after speaking for any length of time. She was asked to self rate her speech 3x/day on a scale of 1(worse) to 10 (normal). She self rated at a 4/5 upon waking and self rated at a 2/3 currently during our session. She will see the neurologist next week to discuss implications of stroke with MG. She completed a planning task in session with 100% acc, no assist. She also remembered to bring in her form for mortgage closing and she walked me through how she would fill out the form. She primarily complains of fatigue, reduced speech intelligibility, and difficulty incorporating less frequently used words in her vocabulary. Pt given two planning tasks for HEP. Will target complex vocabulary next session. She also plans to read more non fiction at home.    Speech Therapy Frequency 2x / week    Duration 4 weeks    Treatment/Interventions Oral motor exercises;Compensatory strategies;Patient/family education;Functional tasks;Cueing hierarchy;Cognitive reorganization;SLP instruction and feedback;Language facilitation    Potential to Achieve Goals Good    SLP Home Exercise Plan Pt will complete HEP as assigned to facilitate carryover of treatment strategies and  techniques in home environment with written cues    Consulted and Agree with Plan of Care Patient           Patient will benefit from skilled therapeutic intervention in order to improve the following deficits and impairments:   Cognitive communication deficit  Dysarthria and anarthria    Problem List Patient Active Problem List   Diagnosis Date Noted  . Stroke (HCC) 07/25/2020  . Acute ischemic stroke (HCC) 07/24/2020  . Hypokalemia 07/24/2020  . Vomiting 07/24/2020  . Elevated alpha  fetoprotein 07/24/2020  . Prolonged QT interval 07/24/2020  . History of myasthenia gravis 07/24/2020   Thank you,  Havery Moros, CCC-SLP [phone]  [facility] 08/13/2020, 11:18 AM  [facility] [address] [place], [place], [address] Phone: [phone]   Fax:  [phone]   Name: Barbara Steele MRN: [id number] Date of Birth: 12-07-1960

## 2020-08-18 ENCOUNTER — Encounter (HOSPITAL_COMMUNITY): Payer: Self-pay | Admitting: Speech Pathology

## 2020-08-18 ENCOUNTER — Ambulatory Visit (HOSPITAL_COMMUNITY): Payer: BC Managed Care – PPO | Admitting: Speech Pathology

## 2020-08-18 ENCOUNTER — Other Ambulatory Visit: Payer: Self-pay

## 2020-08-18 DIAGNOSIS — R471 Dysarthria and anarthria: Secondary | ICD-10-CM

## 2020-08-18 DIAGNOSIS — R41841 Cognitive communication deficit: Secondary | ICD-10-CM

## 2020-08-18 NOTE — Therapy (Signed)
[name] [address] [place], [place], [address] Phone: [phone]   Fax:  [phone]  Speech Language Pathology Treatment  Patient Details  Name: Barbara Steele MRN: [id number] Date of Birth: 07/29/1961 Referring Provider (SLP): Melody Comas, NP   Encounter Date: 08/18/2020   End of Session - 08/18/20 1128    Visit Number 4    Number of Visits 9    Date for SLP Re-Evaluation 09/08/20    Authorization Type BCBS PPO   $500.00 ded/ $196.66 met10% co-insuranceoop max $1,500.00/ $200.41 met30 visit limit/ 0 usedno auth req   SLP Start Time 1025    SLP Stop Time  1120    SLP Time Calculation (min) 55 min    Activity Tolerance Patient tolerated treatment well           Past Medical History:  Diagnosis Date  . Arthritis   . Myasthenia gravis [facility])     Past Surgical History:  Procedure Laterality Date  . MASTECTOMY      There were no vitals filed for this visit.   Subjective Assessment - 08/18/20 1029    Subjective "I might be getting depressed."    Currently in Pain? No/denies            ADULT SLP TREATMENT - 08/18/20 1039      General Information   Behavior/Cognition Alert;Cooperative;Pleasant mood    Patient Positioning Upright in chair    Oral care provided N/A    HPI Barbara Steele is a 59 y.o. female with medical history significant for myasthenia gravis s/p thymectomy (10 years ago and not on medication) who presented to the emergency department due to slurred speech and gait imbalance on 07/24/20.  Patient states that she first noted mild right facial droop on Monday (10/18), but this appears to have improved slightly. MRI showed: 2.3 cm acute/early subacute infarct within the left corona radiata/lentiform nucleus. Pt had SLE during her acute stay with recommendation for outpatientn SLP follow up for cognitive linguistic changes. 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        Treatment Provided   Treatment provided Cognitive-Linquistic      Pain Assessment   Pain Assessment No/denies pain      Cognitive-Linquistic Treatment   Treatment focused on Cognition;Dysarthria;Patient/family/caregiver education;Aphasia    Skilled Treatment speech intelligibility strategies in conversation, error awareness, self evaluation, memory strategies, word finding, thought organization and deductive reasoning      Assessment / Recommendations / Plan   Plan Continue with current plan of care      Progression Toward Goals   Progression toward goals Progressing toward goals            SLP Education - 08/18/20 1128    Education Details Provided GRE/SAT vocabulary exercises for HEP    Person(s) Educated Patient    Methods Handout    Comprehension Verbalized understanding            SLP Short Term Goals - 08/18/20 1129      SLP SHORT TERM GOAL #1   Title Pt will implement memory strategies in functional therapy activities with 90% acc with min cues.    Baseline 78%    Time 4    Period Weeks    Status On-going    Target Date 09/08/20      SLP SHORT TERM GOAL #2   Title Pt will implement word-finding strategies with 90% accuracy when  unable to verbalize desired word in conversation/functional tasks with min assist.    Baseline 80%    Time 4    Period Weeks    Status On-going    Target Date 09/08/20      SLP SHORT TERM GOAL #3   Title Pt will implement speech intelligibility strategies during conversational speech (decrease rate, over articulate, increase vocal intensity,self correct errors, self monitoring, checking to see if listener understands) with 90% effectiveness with min assist.    Baseline 80% acc    Time 4    Period Weeks    Status On-going    Target Date 09/08/20            SLP Long Term Goals - 08/18/20 1129      SLP LONG TERM GOAL #1   Title Pt will increase speech intelligibility for conversations to [facility] with use of strategies as needed.      Baseline ~80% intelligible in 10 minute convesation    Time 1    Period Months    Status On-going      SLP LONG TERM GOAL #2   Title Pt will increase cognitive linguistic skills (memory and word finding) to [facility] with use of strategies as needed.    Baseline Min impairment    Time 1    Period Months    Status On-going            Plan - 08/18/20 1129    Clinical Impression Statement Pt completed one of two assigned pages of deductive reasoning activities with 100% acc (did not see the other). She continues to report dysarthria at home (daughter rated it a 5/10). She completed a divergent naming task with 100% acc when allowed "extra time" to complete. She completed synonym and multiple meaning activity with 100% acc with allowance for extra time. Complex analogies and word definition task (GRE/SAT) completed with 80% acc with min assist. She indicates that this would have been easier for her to problem solve and eliminate choices before her stroke. She is meeting with the neurologist tomorrow and is hopeful to gain more insight into her stroke and possible implications of Myasthenia Gravis. Continue to address word retrieval, dysarthria, and cognition.   Speech Therapy Frequency 2x / week    Duration 4 weeks    Treatment/Interventions Oral motor exercises;Compensatory strategies;Patient/family education;Functional tasks;Cueing hierarchy;Cognitive reorganization;SLP instruction and feedback;Language facilitation    Potential to Achieve Goals Good    SLP Home Exercise Plan Pt will complete HEP as assigned to facilitate carryover of treatment strategies and techniques in home environment with written cues    Consulted and Agree with Plan of Care Patient           Patient will benefit from skilled therapeutic intervention in order to improve the following deficits and impairments:   Cognitive communication deficit  Dysarthria and anarthria    Problem List Patient Active Problem List    Diagnosis Date Noted  . Stroke (HCC) 07/25/2020  . Acute ischemic stroke (HCC) 07/24/2020  . Hypokalemia 07/24/2020  . Vomiting 07/24/2020  . Elevated alpha fetoprotein 07/24/2020  . Prolonged QT interval 07/24/2020  . History of myasthenia gravis 07/24/2020   Thank you,  Havery Moros, CCC-SLP [phone]  [facility] 08/18/2020, 11:30 AM  [facility] [address] [place], [place], [address] Phone: [phone]   Fax:  [phone]   Name: Laruen Risser MRN: [id number] Date of Birth: 1961-05-29

## 2020-08-19 DIAGNOSIS — I10 Essential (primary) hypertension: Secondary | ICD-10-CM | POA: Diagnosis not present

## 2020-08-19 DIAGNOSIS — G7000 Myasthenia gravis without (acute) exacerbation: Secondary | ICD-10-CM | POA: Diagnosis not present

## 2020-08-19 DIAGNOSIS — I69322 Dysarthria following cerebral infarction: Secondary | ICD-10-CM | POA: Diagnosis not present

## 2020-08-19 DIAGNOSIS — I671 Cerebral aneurysm, nonruptured: Secondary | ICD-10-CM | POA: Diagnosis not present

## 2020-08-20 ENCOUNTER — Other Ambulatory Visit: Payer: Self-pay

## 2020-08-20 ENCOUNTER — Ambulatory Visit (HOSPITAL_COMMUNITY): Payer: BC Managed Care – PPO | Admitting: Speech Pathology

## 2020-08-20 ENCOUNTER — Encounter (HOSPITAL_COMMUNITY): Payer: Self-pay | Admitting: Speech Pathology

## 2020-08-20 DIAGNOSIS — R41841 Cognitive communication deficit: Secondary | ICD-10-CM

## 2020-08-20 DIAGNOSIS — R471 Dysarthria and anarthria: Secondary | ICD-10-CM | POA: Diagnosis not present

## 2020-08-20 NOTE — Therapy (Signed)
[facility] [address] [place], [place], [address] Phone: [phone]   Fax:  [phone]  Speech Language Pathology Treatment  Patient Details  Name: Barbara Steele MRN: [id number] Date of Birth: [birth year] Referring Provider (SLP): Melody Comas, NP   Encounter Date: 08/20/2020   End of Session - 08/20/20 1111    Visit Number 5    Number of Visits 9    Date for SLP Re-Evaluation 09/08/20    Authorization Type [employer] PPO   $500.00 ded/ $196.66 met10% co-insuranceoop max $1,500.00/ $200.41 met30 visit limit/ 0 usedno auth req   SLP Start Time 1034    SLP Stop Time  1120    SLP Time Calculation (min) 46 min    Activity Tolerance Patient tolerated treatment well           Past Medical History:  Diagnosis Date  . Arthritis   . Myasthenia gravis [facility])     Past Surgical History:  Procedure Laterality Date  . MASTECTOMY      There were no vitals filed for this visit.   Subjective Assessment - 08/20/20 1109    Subjective "I saw the neurologist yesterday and didn't really get any answers."    Currently in Pain? No/denies             ADULT SLP TREATMENT - 08/20/20 1110      General Information   Behavior/Cognition Alert;Cooperative;Pleasant mood    Patient Positioning Upright in chair    Oral care provided N/A    HPI Barbara Steele is a 59 y.o. female with medical history significant for myasthenia gravis s/p thymectomy (10 years ago and not on medication) who presented to the emergency department due to slurred speech and gait imbalance on 07/24/20.  Patient states that she first noted mild right facial droop on Monday (10/18), but this appears to have improved slightly. MRI showed: 2.3 cm acute/early subacute infarct within the left corona radiata/lentiform nucleus. Pt had SLE during her acute stay with recommendation for outpatientn SLP follow up for cognitive linguistic changes. 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       Treatment Provided   Treatment provided Cognitive-Linquistic      Pain Assessment   Pain Assessment No/denies pain      Cognitive-Linquistic Treatment   Treatment focused on Cognition;Dysarthria;Patient/family/caregiver education;Aphasia    Skilled Treatment speech intelligibility strategies in conversation, error awareness, self evaluation, memory strategies, word finding, thought organization and deductive reasoning      Assessment / Recommendations / Plan   Plan Continue with current plan of care      Progression Toward Goals   Progression toward goals Progressing toward goals              SLP Short Term Goals - 08/20/20 1113      SLP SHORT TERM GOAL #1   Title Pt will implement memory strategies in functional therapy activities with 90% acc with min cues.    Baseline 78%    Time 4    Period Weeks    Status On-going    Target Date 09/08/20      SLP SHORT TERM GOAL #2   Title Pt will implement word-finding strategies with 90% accuracy when unable to verbalize desired word in conversation/functional tasks with min assist.    Baseline 80%    Time 4    Period Weeks    Status On-going    Target  Date 09/08/20      SLP SHORT TERM GOAL #3   Title Pt will implement speech intelligibility strategies during conversational speech (decrease rate, over articulate, increase vocal intensity,self correct errors, self monitoring, checking to see if listener understands) with 90% effectiveness with min assist.    Baseline 80% acc    Time 4    Period Weeks    Status On-going    Target Date 09/08/20            SLP Long Term Goals - 08/20/20 1113      SLP LONG TERM GOAL #1   Title Pt will increase speech intelligibility for conversations to [facility] with use of strategies as needed.    Baseline ~80% intelligible in 10 minute convesation    Time 1    Period Months    Status On-going      SLP LONG TERM GOAL #2   Title Pt will increase  cognitive linguistic skills (memory and word finding) to [facility] with use of strategies as needed.    Baseline Min impairment    Time 1    Period Months    Status On-going            Plan - 08/20/20 1112    Clinical Impression Statement Pt visibly upset this AM and her speech was less intelligible. She went to her initial neurology appointment yesterday and didn't feel that she received any answers as to why she had the stroke, what she needs to do to prevent another, and what the possible implications of MG is having. She will likely seek another opinion. She indicated that stress (from yesterday) negatively impacted her speech and her thought processing (when cooking dinner last night). She was asked to verbalize the steps in making the stir fry dish from start to finish (buying items at the grocery to plating the food). She followed a written outline provided by SLP to assist in organization. She was prompted for additional details x 2 and speech noted to be less intelligible than previous session (even with mask removed). Continue plan of care.    Speech Therapy Frequency 2x / week    Duration 4 weeks    Treatment/Interventions Oral motor exercises;Compensatory strategies;Patient/family education;Functional tasks;Cueing hierarchy;Cognitive reorganization;SLP instruction and feedback;Language facilitation    Potential to Achieve Goals Good    SLP Home Exercise Plan Pt will complete HEP as assigned to facilitate carryover of treatment strategies and techniques in home environment with written cues    Consulted and Agree with Plan of Care Patient           Patient will benefit from skilled therapeutic intervention in order to improve the following deficits and impairments:   Cognitive communication deficit  Dysarthria and anarthria    Problem List Patient Active Problem List   Diagnosis Date Noted  . Stroke (HCC) 07/25/2020  . Acute ischemic stroke (HCC) 07/24/2020  . Hypokalemia  07/24/2020  . Vomiting 07/24/2020  . Elevated alpha fetoprotein 07/24/2020  . Prolonged QT interval 07/24/2020  . History of myasthenia gravis 07/24/2020   Thank you,  Havery Moros, CCC-SLP [phone]  [facility] 08/20/2020, 11:14 AM  [facility] [address] [place], [place], [address] Phone: [phone]   Fax:  [phone]   Name: Barbara Steele MRN: [id number] Date of Birth: [birth year]

## 2020-08-22 DIAGNOSIS — Z1212 Encounter for screening for malignant neoplasm of rectum: Secondary | ICD-10-CM | POA: Diagnosis not present

## 2020-08-22 DIAGNOSIS — Z1211 Encounter for screening for malignant neoplasm of colon: Secondary | ICD-10-CM | POA: Diagnosis not present

## 2020-08-25 ENCOUNTER — Ambulatory Visit (HOSPITAL_COMMUNITY): Payer: BC Managed Care – PPO | Admitting: Speech Pathology

## 2020-08-25 ENCOUNTER — Encounter (HOSPITAL_COMMUNITY): Payer: Self-pay | Admitting: Speech Pathology

## 2020-08-25 ENCOUNTER — Other Ambulatory Visit: Payer: Self-pay

## 2020-08-25 DIAGNOSIS — R471 Dysarthria and anarthria: Secondary | ICD-10-CM | POA: Diagnosis not present

## 2020-08-25 DIAGNOSIS — R41841 Cognitive communication deficit: Secondary | ICD-10-CM | POA: Diagnosis not present

## 2020-08-25 NOTE — Therapy (Signed)
[facility] [address] [place], [place], [address] Phone: [phone]   Fax:  [phone]  Speech Language Pathology Treatment  Patient Details  Name: Barbara Steele MRN: [id number] Date of Birth: 13-Jul-1961 Referring Provider (SLP): Melody Comas, NP   Encounter Date: 08/25/2020   End of Session - 08/25/20 1043    Visit Number 6    Number of Visits 9    Date for SLP Re-Evaluation 09/08/20    Authorization Type [employer] PPO   $500.00 ded/ $196.66 met10% co-insuranceoop max $1,500.00/ $200.41 met30 visit limit/ 0 usedno auth req   SLP Start Time 1033    SLP Stop Time  1120    SLP Time Calculation (min) 47 min    Activity Tolerance Patient tolerated treatment well           Past Medical History:  Diagnosis Date  . Arthritis   . Myasthenia gravis [facility])     Past Surgical History:  Procedure Laterality Date  . MASTECTOMY      There were no vitals filed for this visit.   Subjective Assessment - 08/25/20 1038    Subjective "I had my FMLA paperwork completed."    Currently in Pain? No/denies                 ADULT SLP TREATMENT - 08/25/20 1040      General Information   Behavior/Cognition Alert;Cooperative;Pleasant mood    Patient Positioning Upright in chair    Oral care provided N/A    HPI Barbara Steele is a 59 y.o. female with medical history significant for myasthenia gravis s/p thymectomy (10 years ago and not on medication) who presented to the emergency department due to slurred speech and gait imbalance on 07/24/20.  Patient states that she first noted mild right facial droop on Monday (10/18), but this appears to have improved slightly. MRI showed: 2.3 cm acute/early subacute infarct within the left corona radiata/lentiform nucleus. Pt had SLE during her acute stay with recommendation for outpatientn SLP follow up for cognitive linguistic changes. 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       Treatment Provided   Treatment provided Cognitive-Linquistic      Pain Assessment   Pain Assessment No/denies pain      Cognitive-Linquistic Treatment   Treatment focused on Cognition;Dysarthria;Patient/family/caregiver education;Aphasia    Skilled Treatment speech intelligibility strategies in conversation, error awareness, self evaluation, memory strategies, word finding, thought organization and deductive reasoning      Assessment / Recommendations / Plan   Plan Continue with current plan of care      Progression Toward Goals   Progression toward goals Progressing toward goals              SLP Short Term Goals - 08/25/20 1046      SLP SHORT TERM GOAL #1   Title Pt will implement memory strategies in functional therapy activities with 90% acc with min cues.    Baseline 78%    Time 4    Period Weeks    Status On-going    Target Date 09/08/20      SLP SHORT TERM GOAL #2   Title Pt will implement word-finding strategies with 90% accuracy when unable to verbalize desired word in conversation/functional tasks with min assist.    Baseline 80%    Time 4    Period Weeks    Status On-going    Target Date  09/08/20      SLP SHORT TERM GOAL #3   Title Pt will implement speech intelligibility strategies during conversational speech (decrease rate, over articulate, increase vocal intensity,self correct errors, self monitoring, checking to see if listener understands) with 90% effectiveness with min assist.    Baseline 80% acc    Time 4    Period Weeks    Status On-going    Target Date 09/08/20            SLP Long Term Goals - 08/25/20 1047      SLP LONG TERM GOAL #1   Title Pt will increase speech intelligibility for conversations to [facility] with use of strategies as needed.    Baseline ~80% intelligible in 10 minute convesation    Time 1    Period Months    Status On-going      SLP LONG TERM GOAL #2   Title Pt will increase cognitive linguistic skills  (memory and word finding) to [facility] with use of strategies as needed.    Baseline Min impairment    Time 1    Period Months    Status On-going            Plan - 08/25/20 1046    Clinical Impression Statement Pt in better spirits today and has a plan to seek another neurology opinion regarding her MG related to current symptoms. Speech continues to be dysarthric, however she is using compensatory strategies with indirect cues. She was audio recorded several times, using a voicemail template. Pt encouraged to practice saying her first and last name as /l/ and "dg" sounds appear to be more challenging for her to produce "crisply". SLP provided cues to swallow more frequently and replenish her breath during purposeful pauses. She did no complete HEP (vocabulary and planning tasks) so she was encouraged to complete before next session. Cognition is improved, however, her thinking feels "slower".    Speech Therapy Frequency 2x / week    Duration 4 weeks    Treatment/Interventions Oral motor exercises;Compensatory strategies;Patient/family education;Functional tasks;Cueing hierarchy;Cognitive reorganization;SLP instruction and feedback;Language facilitation    Potential to Achieve Goals Good    SLP Home Exercise Plan Pt will complete HEP as assigned to facilitate carryover of treatment strategies and techniques in home environment with written cues    Consulted and Agree with Plan of Care Patient           Patient will benefit from skilled therapeutic intervention in order to improve the following deficits and impairments:   Dysarthria and anarthria  Cognitive communication deficit    Problem List Patient Active Problem List   Diagnosis Date Noted  . Stroke (HCC) 07/25/2020  . Acute ischemic stroke (HCC) 07/24/2020  . Hypokalemia 07/24/2020  . Vomiting 07/24/2020  . Elevated alpha fetoprotein 07/24/2020  . Prolonged QT interval 07/24/2020  . History of myasthenia gravis 07/24/2020    Thank you,  Havery Moros, CCC-SLP [phone]  [facility] 08/25/2020, 10:48 AM  [facility] [address] [place], [place], [address] Phone: [phone]   Fax:  [phone]   Name: Barbara Steele MRN: [id number] Date of Birth: 02-Oct-1961

## 2020-08-27 ENCOUNTER — Ambulatory Visit (HOSPITAL_COMMUNITY): Payer: BC Managed Care – PPO | Admitting: Speech Pathology

## 2020-08-27 ENCOUNTER — Encounter (HOSPITAL_COMMUNITY): Payer: Self-pay | Admitting: Speech Pathology

## 2020-08-27 ENCOUNTER — Other Ambulatory Visit: Payer: Self-pay

## 2020-08-27 DIAGNOSIS — R41841 Cognitive communication deficit: Secondary | ICD-10-CM

## 2020-08-27 DIAGNOSIS — R471 Dysarthria and anarthria: Secondary | ICD-10-CM | POA: Diagnosis not present

## 2020-08-27 NOTE — Therapy (Signed)
[facility] [address] [place], [place], [address] Phone: [phone]   Fax:  [phone]  Speech Language Pathology Treatment  Patient Details  Name: Barbara Steele MRN: [id number] Date of Birth: Sep 12, 1961 Referring Provider (SLP): Melody Comas, NP   Encounter Date: 08/27/2020   End of Session - 08/27/20 1053    Visit Number 7    Number of Visits 9    Date for SLP Re-Evaluation 09/08/20    Authorization Type BCBS PPO   $500.00 ded/ $196.66 met10% co-insuranceoop max $1,500.00/ $200.41 met30 visit limit/ 0 usedno auth req   SLP Start Time 1037    SLP Stop Time  1122    SLP Time Calculation (min) 45 min    Activity Tolerance Patient tolerated treatment well           Past Medical History:  Diagnosis Date  . Arthritis   . Myasthenia gravis [facility])     Past Surgical History:  Procedure Laterality Date  . MASTECTOMY      There were no vitals filed for this visit.   Subjective Assessment - 08/27/20 1046    Subjective "I am trying to swallow more frequently."    Currently in Pain? No/denies                 ADULT SLP TREATMENT - 08/27/20 1049      General Information   Behavior/Cognition Alert;Cooperative;Pleasant mood    Patient Positioning Upright in chair    Oral care provided N/A    HPI Barbara Steele is a 59 y.o. female with medical history significant for myasthenia gravis s/p thymectomy (10 years ago and not on medication) who presented to the emergency department due to slurred speech and gait imbalance on 07/24/20.  Patient states that she first noted mild right facial droop on Monday (10/18), but this appears to have improved slightly. MRI showed: 2.3 cm acute/early subacute infarct within the left corona radiata/lentiform nucleus. Pt had SLE during her acute stay with recommendation for outpatientn SLP follow up for cognitive linguistic changes. 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       Treatment Provided   Treatment provided Cognitive-Linquistic      Pain Assessment   Pain Assessment No/denies pain      Cognitive-Linquistic Treatment   Treatment focused on Cognition;Dysarthria;Patient/family/caregiver education;Aphasia    Skilled Treatment speech intelligibility strategies in conversation, error awareness, self evaluation, memory strategies, word finding, thought organization and deductive reasoning      Assessment / Recommendations / Plan   Plan Continue with current plan of care      Progression Toward Goals   Progression toward goals Progressing toward goals              SLP Short Term Goals - 08/27/20 1113      SLP SHORT TERM GOAL #1   Title Pt will implement memory strategies in functional therapy activities with 90% acc with min cues.    Baseline 78%    Time 4    Period Weeks    Status Achieved    Target Date 09/08/20      SLP SHORT TERM GOAL #2   Title Pt will implement word-finding strategies with 90% accuracy when unable to verbalize desired word in conversation/functional tasks with min assist.    Baseline 80%    Time 4    Period Weeks    Status Achieved    Target  Date 09/08/20      SLP SHORT TERM GOAL #3   Title Pt will implement speech intelligibility strategies during conversational speech (decrease rate, over articulate, increase vocal intensity,self correct errors, self monitoring, checking to see if listener understands) with 90% effectiveness with min assist.    Baseline 80% acc    Time 4    Period Weeks    Status On-going    Target Date 09/08/20            SLP Long Term Goals - 08/27/20 1114      SLP LONG TERM GOAL #1   Title Pt will increase speech intelligibility for conversations to [facility] with use of strategies as needed.    Baseline ~80% intelligible in 10 minute convesation    Time 1    Period Months    Status On-going      SLP LONG TERM GOAL #2   Title Pt will increase cognitive linguistic  skills (memory and word finding) to [facility] with use of strategies as needed.    Baseline Min impairment    Time 1    Period Months    Status On-going            Plan - 08/27/20 1113    Clinical Impression Statement Pt alert and cooperative for treatment. She wrote down phone scenarios and practiced at home. Pt read aloud in session with good articulation, but with reduced naturalness (to be expected with oral reading). She has greater difficulty with /s/ words so these were practiced in session. Pt asked to mark "problem" words and add to the list at home. She exhibited some difficulty with prosody and appropriate emphasis on specific words so she was given words and sentences to practice for homework. 10 item memory task completed with 8/10 on first trial and 10/10 on second trial. Pt was then able to recall all 10 words without cues. Pt continues to make good progress toward goals. Additional sessions will focus on speech as opposed to cognition. Pt in agreement with plan of care.    Speech Therapy Frequency 2x / week    Duration 4 weeks    Treatment/Interventions Oral motor exercises;Compensatory strategies;Patient/family education;Functional tasks;Cueing hierarchy;Cognitive reorganization;SLP instruction and feedback;Language facilitation    Potential to Achieve Goals Good    SLP Home Exercise Plan Pt will complete HEP as assigned to facilitate carryover of treatment strategies and techniques in home environment with written cues    Consulted and Agree with Plan of Care Patient           Patient will benefit from skilled therapeutic intervention in order to improve the following deficits and impairments:   Dysarthria and anarthria  Cognitive communication deficit    Problem List Patient Active Problem List   Diagnosis Date Noted  . Stroke (HCC) 07/25/2020  . Acute ischemic stroke (HCC) 07/24/2020  . Hypokalemia 07/24/2020  . Vomiting 07/24/2020  . Elevated alpha fetoprotein  07/24/2020  . Prolonged QT interval 07/24/2020  . History of myasthenia gravis 07/24/2020   Thank you,  Havery Moros, CCC-SLP [phone]  [facility] 08/27/2020, 11:20 AM  [facility] [address] [place], [place], [address] Phone: [phone]   Fax:  [phone]   Name: Barbara Steele MRN: [id number] Date of Birth: 20-Sep-1961

## 2020-09-01 ENCOUNTER — Other Ambulatory Visit: Payer: Self-pay

## 2020-09-01 ENCOUNTER — Ambulatory Visit (HOSPITAL_COMMUNITY): Payer: BC Managed Care – PPO | Admitting: Speech Pathology

## 2020-09-01 ENCOUNTER — Encounter (HOSPITAL_COMMUNITY): Payer: Self-pay | Admitting: Speech Pathology

## 2020-09-01 DIAGNOSIS — R471 Dysarthria and anarthria: Secondary | ICD-10-CM | POA: Diagnosis not present

## 2020-09-01 DIAGNOSIS — R41841 Cognitive communication deficit: Secondary | ICD-10-CM

## 2020-09-01 NOTE — Therapy (Signed)
[facility] [address] [place], [place], [address] Phone: [phone]   Fax:  [phone]  Speech Language Pathology Treatment  Patient Details  Name: Barbara Steele MRN: [id number] Date of Birth: 1960-12-31 Referring Provider (SLP): Melody Comas, NP   Encounter Date: 09/01/2020   End of Session - 09/01/20 1102    Visit Number 8    Number of Visits 9    Date for SLP Re-Evaluation 09/08/20    Authorization Type BCBS PPO   $500.00 ded/ $196.66 met10% co-insuranceoop max $1,500.00/ $200.41 met30 visit limit/ 0 usedno auth req   SLP Start Time 1033    SLP Stop Time  1118    SLP Time Calculation (min) 45 min    Activity Tolerance Patient tolerated treatment well           Past Medical History:  Diagnosis Date  . Arthritis   . Myasthenia gravis [facility])     Past Surgical History:  Procedure Laterality Date  . MASTECTOMY      There were no vitals filed for this visit.   Subjective Assessment - 09/01/20 1042    Subjective "I just talk slower."    Currently in Pain? No/denies                 ADULT SLP TREATMENT - 09/01/20 1043      General Information   Behavior/Cognition Alert;Pleasant mood;Cooperative    Patient Positioning Upright in chair    Oral care provided N/A    HPI Barbara Steele is a 59 y.o. female with medical history significant for myasthenia gravis s/p thymectomy (10 years ago and not on medication) who presented to the emergency department due to slurred speech and gait imbalance on 07/24/20.  Patient states that she first noted mild right facial droop on Monday (10/18), but this appears to have improved slightly. MRI showed: 2.3 cm acute/early subacute infarct within the left corona radiata/lentiform nucleus. Pt had SLE during her acute stay with recommendation for outpatientn SLP follow up for cognitive linguistic changes. 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        Treatment Provided   Treatment provided Cognitive-Linquistic      Pain Assessment   Pain Assessment No/denies pain      Cognitive-Linquistic Treatment   Treatment focused on Dysarthria;Patient/family/caregiver education    Skilled Treatment speech intelligibility strategies, functional conversation, error awareness, self correction      Assessment / Recommendations / Plan   Plan Continue with current plan of care      Progression Toward Goals   Progression toward goals Progressing toward goals            SLP Education - 09/01/20 1101    Education Details provided breath work exercise    [employer]) Educated Patient    Methods Explanation;Demonstration;Handout            SLP Short Term Goals - 09/01/20 1103      SLP SHORT TERM GOAL #1   Title Pt will implement memory strategies in functional therapy activities with 90% acc with min cues.    Baseline 78%    Time 4    Period Weeks    Status Achieved    Target Date 09/08/20      SLP SHORT TERM GOAL #2   Title Pt will implement word-finding strategies with 90% accuracy when unable to verbalize desired word in conversation/functional tasks with min assist.  Baseline 80%    Time 4    Period Weeks    Status Achieved    Target Date 09/08/20      SLP SHORT TERM GOAL #3   Title Pt will implement speech intelligibility strategies during conversational speech (decrease rate, over articulate, increase vocal intensity,self correct errors, self monitoring, checking to see if listener understands) with 90% effectiveness with min assist.    Baseline 80% acc    Time 4    Period Weeks    Status On-going    Target Date 09/08/20            SLP Long Term Goals - 09/01/20 1104      SLP LONG TERM GOAL #1   Title Pt will increase speech intelligibility for conversations to [facility] with use of strategies as needed.    Baseline ~80% intelligible in 10 minute convesation    Time 1    Period Months    Status On-going      SLP LONG  TERM GOAL #2   Title Pt will increase cognitive linguistic skills (memory and word finding) to [facility] with use of strategies as needed.    Baseline Min impairment    Time 1    Period Months    Status On-going            Plan - 09/01/20 1103    Clinical Impression Statement Pt completed HEP as assigned with 95% accuracy (vocabulary, multiple meanings). She spoke with family over the weekend on the phone and her daughter indicated that her speech is slower now. [name] continues to present with mild dysarthria with reduced intonation, reduced breath support, and imprecise articulation for specific sounds (s, sh, ch). Pt reported excellent diction prior to her stroke and expresses that she feels self conscious about her speech now. 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 During oral reading of multisyllabic words, she was encouraged to focus on over articulation (open your mouth). This is also reiterated in conversational speech. She was given breathing exercises to practice at home. Plan for one more SLP session before discharge next Monday. She plans to seek a second opinion in neurology. Continue plan of care.     Speech Therapy Frequency 2x / week    Duration 4 weeks    Treatment/Interventions Oral motor exercises;Compensatory strategies;Patient/family education;Functional tasks;Cueing hierarchy;Cognitive reorganization;SLP instruction and feedback;Language facilitation    Potential to Achieve Goals Good    SLP Home Exercise Plan Pt will complete HEP as assigned to facilitate carryover of treatment strategies and techniques in home environment with written cues    Consulted and Agree with Plan of Care Patient           Patient will benefit from skilled therapeutic intervention in order to improve the following deficits and impairments:   Dysarthria and anarthria  Cognitive communication deficit    Problem List Patient Active Problem List   Diagnosis  Date Noted  . Stroke (HCC) 07/25/2020  . Acute ischemic stroke (HCC) 07/24/2020  . Hypokalemia 07/24/2020  . Vomiting 07/24/2020  . Elevated alpha fetoprotein 07/24/2020  . Prolonged QT interval 07/24/2020  . History of myasthenia gravis 07/24/2020   Thank you,  Havery Moros, CCC-SLP [phone]  [facility] 09/01/2020, 11:05 AM  [facility] [address] [place], [place], [address] Phone: [phone]   Fax:  [phone]   Name: Prisilla Kocsis MRN: [id number] Date of Birth: 1961/02/24

## 2020-09-03 ENCOUNTER — Ambulatory Visit (HOSPITAL_COMMUNITY): Payer: BC Managed Care – PPO | Admitting: Speech Pathology

## 2020-09-03 LAB — COLOGUARD: COLOGUARD: NEGATIVE

## 2020-09-08 ENCOUNTER — Ambulatory Visit (HOSPITAL_COMMUNITY): Payer: BC Managed Care – PPO | Attending: Gerontology | Admitting: Speech Pathology

## 2020-09-08 ENCOUNTER — Encounter (HOSPITAL_COMMUNITY): Payer: Self-pay | Admitting: Speech Pathology

## 2020-09-08 ENCOUNTER — Other Ambulatory Visit: Payer: Self-pay

## 2020-09-08 DIAGNOSIS — R471 Dysarthria and anarthria: Secondary | ICD-10-CM | POA: Insufficient documentation

## 2020-09-08 DIAGNOSIS — R41841 Cognitive communication deficit: Secondary | ICD-10-CM | POA: Insufficient documentation

## 2020-09-08 NOTE — Therapy (Signed)
Barbara Steele, [place], [address] Phone: [phone]   Fax:  [phone]  Speech Language Pathology Treatment  Patient Details  Name: Barbara Steele MRN: [id number] Date of Birth: 12/04/1960 Referring Provider (SLP): Wilfred Curtis, NP   Encounter Date: 09/08/2020   End of Session - 09/08/20 1049    Visit Number 9    Number of Visits 9    Date for SLP Re-Evaluation 09/08/20    Authorization Type BCBS PPO   $500.00 ded/ $196.66 met10% co-insuranceoop max $1,500.00/ $200.41 met30 visit limit/ 0 usedno auth req   SLP Start Time 1031    SLP Stop Time  1115    SLP Time Calculation (min) 44 min    Activity Tolerance Patient tolerated treatment well           Past Medical History:  Diagnosis Date  . Arthritis   . Myasthenia gravis [facility])     Past Surgical History:  Procedure Laterality Date  . MASTECTOMY      There were no vitals filed for this visit.   Subjective Assessment - 09/08/20 1046    Subjective "I slept for 11 hours and woke up feeling 100%, but got tired after that."    Currently in Pain? No/denies             ADULT SLP TREATMENT - 09/08/20 1049      General Information   Behavior/Cognition Alert;Pleasant mood;Cooperative    Patient Positioning Upright in chair    Oral care provided N/A    HPI Barbara Steele is a 59 y.o. female with medical history significant for myasthenia gravis s/p thymectomy (10 years ago and not on medication) who presented to the emergency department due to slurred speech and gait imbalance on 07/24/20.  Patient states that she first noted mild right facial droop on Monday (10/18), but this appears to have improved slightly. MRI showed: 2.3 cm acute/early subacute infarct within the left corona radiata/lentiform nucleus. Pt had SLE during her acute stay with recommendation for outpatientn SLP follow up for cognitive linguistic changes. 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       Treatment Provided   Treatment provided Cognitive-Linquistic      Pain Assessment   Pain Assessment No/denies pain      Cognitive-Linquistic Treatment   Treatment focused on Dysarthria;Patient/family/caregiver education    Skilled Treatment speech intelligibility strategies, functional conversation, error awareness, self correction      Assessment / Recommendations / Plan   Plan All goals met;Discharge SLP treatment due to (comment)      Progression Toward Goals   Progression toward goals Goals met, education completed, patient discharged from [facility] - 09/08/20 [place] #1   Title Pt will implement memory strategies in functional therapy activities with 90% acc with min cues.    Baseline 78%    Time 4    Period Weeks    Status Achieved    Target Date 09/08/20      SLP SHORT TERM GOAL #2   Title Pt will implement word-finding strategies with 90% accuracy when unable to verbalize desired word in conversation/functional tasks with min assist.    Baseline 80%    Time 4    Period Weeks    Status Achieved  Target Date 09/08/20      SLP SHORT TERM GOAL #3   Title Pt will implement speech intelligibility strategies during conversational speech (decrease rate, over articulate, increase vocal intensity,self correct errors, self monitoring, checking to see if listener understands) with 90% effectiveness with min assist.    Baseline 80% acc    Time 4    Period Weeks    Status Achieved    Target Date 09/08/20            SLP Long Term Goals - 09/08/20 1050      SLP LONG TERM GOAL #1   Title Pt will increase speech intelligibility for conversations to [facility] with use of strategies as needed.    Baseline ~80% intelligible in 10 minute convesation    Time 1    Period Months    Status Achieved      SLP LONG TERM GOAL #2   Title Pt will increase cognitive linguistic skills  (memory and word finding) to [facility] with use of strategies as needed.    Baseline Min impairment    Time 1    Period Months    Status Achieved            Plan - 09/08/20 1050    Clinical Impression Statement Pt seen for final SLP session in preparation for discharge. Pt continues to present with mild dysarthria negatively impacting precise articulation of specific sounds and multisyllabic words (s, sh, j). Pt continues to be quite self conscious about her speech. She notes improved intelligibility in the mornings and after periods of rest. She feels her speech deteriorates over time and when fatigued. Her cognitive skills have improved, however is also impacted by fatigue. Pt identified barriers to returning to work at this time which include: feelings of exhaustion, fatigue, dysarthria, dizziness, and blurry vision which is worse in her right eye. She acknowledges that she can try to use emails and texts, and have other employees make phone calls for her to limit her time speaking on the phone. Her diction is extremely important to her and she continues to be frustrated about her dysarthria. Pt also wonders if her dysarthria is all a result from the stroke or if it could also be due to Myasthenia Gravis. She plans to discuss with neurology. Pt met all goals, but does continue to present with mild dysarthria. She is encouraged to continue practice with HEP provided (oral reading and recording to target speech). No further SLP services indicated at this time. Pt in agreement with plan for discharge.    Treatment/Interventions Oral motor exercises;Compensatory strategies;Patient/family education;Functional tasks;Cueing hierarchy;Cognitive reorganization;SLP instruction and feedback;Language facilitation    Potential to Achieve Goals Good    SLP Home Exercise Plan Pt will complete HEP as assigned to facilitate carryover of treatment strategies and techniques in home environment with written cues     Consulted and Agree with Plan of Care Patient           Patient will benefit from skilled therapeutic intervention in order to improve the following deficits and impairments:   Dysarthria and anarthria  Cognitive communication deficit    Problem List Patient Active Problem List   Diagnosis Date Noted  . Stroke ([place]) 07/25/2020  . Acute ischemic stroke ([place]) 07/24/2020  . Hypokalemia 07/24/2020  . Vomiting 07/24/2020  . Elevated alpha fetoprotein 07/24/2020  . Prolonged QT interval 07/24/2020  . History of myasthenia gravis 07/24/2020   SPEECH THERAPY DISCHARGE SUMMARY  Visits from Start of Care:  9  Current functional level related to goals / functional outcomes: Goals met, see above. Mild dysarthria persists   Remaining deficits: Mild dysarthria   Education / Equipment: HEP provided  Plan: Patient agrees to discharge.  Patient goals were met. Patient is being discharged due to meeting the stated rehab goals.  ?????        Thank you,  Genene Churn, [place]  [facility] 09/08/2020, 10:51 AM  [place] [address] [place], [place], [address] Phone: [phone]   Fax:  [phone]   Name: Henrietta Cieslewicz MRN: [id number] Date of Birth: 1960-10-14

## 2020-09-10 DIAGNOSIS — I671 Cerebral aneurysm, nonruptured: Secondary | ICD-10-CM | POA: Diagnosis not present

## 2020-09-10 DIAGNOSIS — I69322 Dysarthria following cerebral infarction: Secondary | ICD-10-CM | POA: Diagnosis not present

## 2020-09-10 DIAGNOSIS — I10 Essential (primary) hypertension: Secondary | ICD-10-CM | POA: Diagnosis not present

## 2020-09-10 DIAGNOSIS — G7000 Myasthenia gravis without (acute) exacerbation: Secondary | ICD-10-CM | POA: Diagnosis not present

## 2020-09-16 ENCOUNTER — Encounter: Payer: BC Managed Care – PPO | Admitting: Adult Health

## 2020-09-18 ENCOUNTER — Other Ambulatory Visit: Payer: Self-pay

## 2020-09-18 ENCOUNTER — Encounter: Payer: Self-pay | Admitting: Adult Health

## 2020-09-18 ENCOUNTER — Other Ambulatory Visit (HOSPITAL_COMMUNITY)
Admission: RE | Admit: 2020-09-18 | Discharge: 2020-09-18 | Disposition: A | Discharge status: Home / Self Care | Payer: BC Managed Care – PPO | Source: Ambulatory Visit | Attending: Adult Health | Admitting: Adult Health

## 2020-09-18 ENCOUNTER — Ambulatory Visit (INDEPENDENT_AMBULATORY_CARE_PROVIDER_SITE_OTHER): Payer: BC Managed Care – PPO | Admitting: Adult Health

## 2020-09-18 VITALS — BP 135/82 | HR 98 | Ht 67.0 in | Wt 151.0 lb

## 2020-09-18 DIAGNOSIS — Z01419 Encounter for gynecological examination (general) (routine) without abnormal findings: Secondary | ICD-10-CM | POA: Diagnosis not present

## 2020-09-18 DIAGNOSIS — Z124 Encounter for screening for malignant neoplasm of cervix: Secondary | ICD-10-CM | POA: Diagnosis not present

## 2020-09-18 DIAGNOSIS — F329 Major depressive disorder, single episode, unspecified: Secondary | ICD-10-CM | POA: Insufficient documentation

## 2020-09-18 DIAGNOSIS — F32A Depression, unspecified: Secondary | ICD-10-CM

## 2020-09-18 DIAGNOSIS — Z8673 Personal history of transient ischemic attack (TIA), and cerebral infarction without residual deficits: Secondary | ICD-10-CM | POA: Diagnosis not present

## 2020-09-18 NOTE — Progress Notes (Signed)
Patient ID: Barbara Steele, female   DOB: 10/02/1961, 59 y.o.   MRN: [id number] History of Present Illness:  Barbara Steele is a 59 year old black female, divorced, PM in for pelvic and pap. She says she had a stroke 07/24/20, and still has some weakness on the right. She is not working at present, says her job is stressful, her daughter is with her til January.  PCP is Dr Felecia Shelling.  Current Medications, Allergies, Past Medical History, Past Surgical History, Family History and Social History were reviewed in [employer] record.     Review of Systems: Patient denies any headaches, hearing loss, fatigue, blurred vision, shortness of breath, chest pain, abdominal pain, problems with bowel movements, urination, or intercourse(not active). No joint pain or mood swings. Denies any vaginal bleeding   Physical Exam:BP 135/82 (BP Location: Left Arm, Patient Position: Sitting, Cuff Size: Normal)   Pulse 98   Ht 5\' 7"  (1.702 m)   Wt 151 lb (68.5 kg)   BMI 23.65 kg/m  General:  Well developed, well nourished, no acute distress Skin:  Warm and dry Neck:  Midline trachea, normal thyroid, good ROM, no lymphadenopathy Lungs; Clear to auscultation bilaterally Cardiovascular: Regular rate and rhythm Pelvic:  External genitalia is normal in appearance, no lesions.  The vagina is pale with loss of moisture and rugae. Urethra has no lesions or masses. The cervix is smooth, pap with high risk HPV genotyping performed.  Uterus is felt to be normal size, shape, and contour.  No adnexal masses or tenderness noted.Bladder is non tender, no masses felt. Rectal: Deferred at her request, she said she did cologuard 3 weeks ago. Extremities/musculoskeletal:  No swelling or varicosities noted, no clubbing or cyanosis Psych:  No mood changes, alert and cooperative,seems happy AA is 0 Fall risk is low PHQ 9 score is 15 she declines GAD 7,she declines meds, offered referral to [facility] but she  declined for now, but can me or Dr [facility] if she wants one.   [place] - 09/18/20 1052      Pregnancy Intention Screening   Does the patient want to become pregnant in the next year? N/A    Does the patient's partner want to become pregnant in the next year? N/A    Would the patient like to discuss contraceptive options today? N/A      Contraception Wrap Up   Current Method No Method - Other Reason   postmenopausal   End Method No Method - Other Reason   postmenopausal   Contraception Counseling Provided No         Examination chaperoned by 09/20/20 CMA.  Impression and Plan: 1. Routine cervical smear Pap sent Pap in 3 years if normal  2. Encounter for cervical Pap smear with pelvic exam Pap sent Pap in 3 years of normal  Physical with PCP Labs with PCP Mammogram yearly Cologuard every 3 years if normal    3. Depression, unspecified depression type Call if wants referral to [facility]  4. History of stroke

## 2020-09-19 LAB — CYTOLOGY - PAP
Adequacy: ABSENT
Comment: NEGATIVE
Diagnosis: NEGATIVE
High risk HPV: NEGATIVE

## 2020-10-13 DIAGNOSIS — I69322 Dysarthria following cerebral infarction: Secondary | ICD-10-CM | POA: Diagnosis not present

## 2020-10-13 DIAGNOSIS — I10 Essential (primary) hypertension: Secondary | ICD-10-CM | POA: Diagnosis not present

## 2020-10-13 DIAGNOSIS — G7000 Myasthenia gravis without (acute) exacerbation: Secondary | ICD-10-CM | POA: Diagnosis not present

## 2020-10-13 DIAGNOSIS — I671 Cerebral aneurysm, nonruptured: Secondary | ICD-10-CM | POA: Diagnosis not present

## 2021-01-05 DIAGNOSIS — M069 Rheumatoid arthritis, unspecified: Secondary | ICD-10-CM | POA: Insufficient documentation

## 2021-01-05 DIAGNOSIS — I10 Essential (primary) hypertension: Secondary | ICD-10-CM | POA: Diagnosis not present

## 2021-01-05 DIAGNOSIS — I69322 Dysarthria following cerebral infarction: Secondary | ICD-10-CM | POA: Diagnosis not present

## 2021-01-05 DIAGNOSIS — G7000 Myasthenia gravis without (acute) exacerbation: Secondary | ICD-10-CM | POA: Diagnosis not present

## 2021-01-05 DIAGNOSIS — I671 Cerebral aneurysm, nonruptured: Secondary | ICD-10-CM

## 2021-01-05 HISTORY — DX: Cerebral aneurysm, nonruptured: I67.1

## 2021-06-04 ENCOUNTER — Encounter: Payer: Self-pay | Admitting: Internal Medicine

## 2021-06-04 ENCOUNTER — Ambulatory Visit: Payer: BC Managed Care – PPO | Admitting: Internal Medicine

## 2021-06-04 ENCOUNTER — Other Ambulatory Visit: Payer: Self-pay

## 2021-06-04 VITALS — BP 142/84 | HR 50 | Temp 98.4°F | Resp 18 | Ht 67.0 in | Wt 139.1 lb

## 2021-06-04 DIAGNOSIS — Z7689 Persons encountering health services in other specified circumstances: Secondary | ICD-10-CM

## 2021-06-04 DIAGNOSIS — Z1159 Encounter for screening for other viral diseases: Secondary | ICD-10-CM

## 2021-06-04 DIAGNOSIS — Z8673 Personal history of transient ischemic attack (TIA), and cerebral infarction without residual deficits: Secondary | ICD-10-CM

## 2021-06-04 DIAGNOSIS — I10 Essential (primary) hypertension: Secondary | ICD-10-CM

## 2021-06-04 DIAGNOSIS — Z114 Encounter for screening for human immunodeficiency virus [HIV]: Secondary | ICD-10-CM

## 2021-06-04 DIAGNOSIS — Z8669 Personal history of other diseases of the nervous system and sense organs: Secondary | ICD-10-CM

## 2021-06-04 DIAGNOSIS — Z0001 Encounter for general adult medical examination with abnormal findings: Secondary | ICD-10-CM | POA: Diagnosis not present

## 2021-06-04 DIAGNOSIS — M069 Rheumatoid arthritis, unspecified: Secondary | ICD-10-CM

## 2021-06-04 DIAGNOSIS — Z0000 Encounter for general adult medical examination without abnormal findings: Secondary | ICD-10-CM

## 2021-06-04 NOTE — Patient Instructions (Addendum)
Please continue taking medications as prescribed.  You are being referred to Neurology per your request.  Please contact us with the contact information of Rheumatology clinic in [facility].  Please follow low salt diet and ambulate as tolerated.  Please get fasting blood tests before the next visit.

## 2021-06-05 ENCOUNTER — Encounter: Payer: Self-pay | Admitting: Internal Medicine

## 2021-06-05 NOTE — Assessment & Plan Note (Signed)
Not on any medication currently Will check CRP Obtain records from her previous Rheumatologist in [place] Used to take Prednisone for RA and Myasthenia Gravis

## 2021-06-05 NOTE — Progress Notes (Addendum)
New Patient Office Visit  Subjective:  Patient ID: Nehemiah Montee, female    DOB: 03/16/1961  Age: 60 y.o. MRN: [id number]  CC:  Chief Complaint  Patient presents with   New Patient (Initial Visit)    New patient was seeing dr Felecia Shelling is seeing dr Gerilyn Pilgrim, would like a referral to [facility] for follow up after stroke    HPI Aubriel Khanna is a 60 y.o. female with PMH of HTN, RA, myasthenia gravis and CVA (07/2020) with residual speech difficulty who presents for establishing care. She is a former patient of Dr Felecia Shelling.  She had a CVA in 07/2020, and has had residual speech difficulty since then. She is on Aspirin and statin. She follows up with Dr Gerilyn Pilgrim since then. She also has h/o myasthenia gravis.  She used to take Mestinon and prednisone for it when she was in [facility].  She also has a history of RA and was placed on low-dose prednisone for management of RA and myasthenia gravis and Mestinon was discontinued. She states that Prednisone was tapered later and she is not taking any medications for RA and myasthenia gravis. She is trying to get records from her Rheumatologist from [facility] as local Rheumatologists would not see her till she obtains records. She asks for a different referral for Neurology for second opinion.  Her BP was mildly elevated in the office today.  She takes amlodipine.  She denies any headache, weakness, chest pain, dyspnea or palpitations.  She has had 2 doses of COVID-vaccine.  She prefers to wait for flu vaccine for now.  Past Medical History:  Diagnosis Date   Acute ischemic stroke (HCC) 07/24/2020   Arthritis    High cholesterol    Hypertension    Mental disorder    Myasthenia gravis (HCC)    Stroke [facility])     Past Surgical History:  Procedure Laterality Date   BREAST LUMPECTOMY     MASTECTOMY     thymectomy      Family History  Problem Relation Age of Onset   Heart attack Paternal Grandfather    Dementia Maternal  Grandmother    Stroke Maternal Grandfather    Dementia Mother    Heart disease Brother    Cancer Sister        cervical    Social History   Socioeconomic History   Marital status: Divorced    Spouse name: Not on file   Number of children: Not on file   Years of education: Not on file   Highest education level: Not on file  Occupational History   Not on file  Tobacco Use   Smoking status: Never   Smokeless tobacco: Never  Vaping Use   Vaping Use: Never used  Substance and Sexual Activity   Alcohol use: Not Currently   Drug use: Never   Sexual activity: Not Currently    Birth control/protection: Post-menopausal  Other Topics Concern   Not on file  Social History Narrative   Not on file   Social Determinants of Health   Financial Resource Strain: Low Risk    Difficulty of Paying Living Expenses: Not hard at all  Food Insecurity: No Food Insecurity   Worried About Programme researcher, broadcasting/film/video in the Last Year: Never true   Ran Out of Food in the Last Year: Never true  Transportation Needs: No Transportation Needs   Lack of Transportation (Medical): No   Lack of Transportation (Non-Medical): No  Physical Activity:  Inactive   Days of Exercise per Week: 0 days   Minutes of Exercise per Session: 0 min  Stress: Stress Concern Present   Feeling of Stress : Very much  Social Connections: Moderately Isolated   Frequency of Communication with Friends and Family: More than three times a week   Frequency of Social Gatherings with Friends and Family: Never   Attends Religious Services: 1 to 4 times per year   Active Member of [employer] or Organizations: No   Attends Engineer, structural: Never   Marital Status: Divorced  Catering manager Violence: Not At Risk   Fear of Current or Ex-Partner: No   Emotionally Abused: No   Physically Abused: No   Sexually Abused: No    ROS Review of Systems  Constitutional:  Negative for chills and fever.  HENT:  Negative for congestion,  sinus pressure, sinus pain and sore throat.   Eyes:  Negative for pain and discharge.  Respiratory:  Negative for cough and shortness of breath.   Cardiovascular:  Negative for chest pain and palpitations.  Gastrointestinal:  Negative for abdominal pain, constipation, diarrhea, nausea and vomiting.  Endocrine: Negative for polydipsia and polyuria.  Genitourinary:  Negative for dysuria and hematuria.  Musculoskeletal:  Positive for arthralgias and back pain. Negative for neck pain and neck stiffness.  Skin:  Negative for rash.  Neurological:  Positive for speech difficulty. Negative for dizziness, weakness and numbness.  Psychiatric/Behavioral:  Negative for agitation and behavioral problems.    Objective:   Today's Vitals: BP (!) 142/84 (BP Location: Left Arm, Cuff Size: Normal)   Pulse (!) 50   Temp 98.4 F (36.9 C) (Oral)   Resp 18   Ht 5\' 7"  (1.702 m)   Wt 139 lb 1.9 oz (63.1 kg)   SpO2 99%   BMI 21.79 kg/m   Physical Exam Vitals reviewed.  Constitutional:      General: She is not in acute distress.    Appearance: She is not diaphoretic.  HENT:     Head: Normocephalic and atraumatic.     Nose: Nose normal.     Mouth/Throat:     Mouth: Mucous membranes are moist.  Eyes:     General: No scleral icterus.    Extraocular Movements: Extraocular movements intact.  Cardiovascular:     Rate and Rhythm: Normal rate and regular rhythm.     Pulses: Normal pulses.     Heart sounds: Normal heart sounds. No murmur heard. Pulmonary:     Breath sounds: Normal breath sounds. No wheezing or rales.  Abdominal:     Palpations: Abdomen is soft.     Tenderness: no abdominal tenderness  Musculoskeletal:     Cervical back: Neck supple. No tenderness.     Right lower leg: No edema.     Left lower leg: No edema.  Skin:    General: Skin is warm.     Findings: No rash.  Neurological:     General: No focal deficit present.     Mental Status: She is alert and oriented to person, place,  and time.     Cranial Nerves: Dysarthria present.     Sensory: No sensory deficit.     Motor: No weakness.  Psychiatric:        Mood and Affect: Mood normal.        Behavior: Behavior normal.    Assessment & Plan:   Problem List Items Addressed This Visit       Encounter to  establish care    -  Primary Care established History and medications reviewed with the patient  Cardiovascular and Mediastinum   Essential hypertension    BP Readings from Last 1 Encounters:  06/04/21 (!) 142/84  Elevated with Amlodipine 5 mg Could be due to first visit related anxiety, will keep the same medication for now Counseled for compliance with the medications Advised DASH diet and moderate exercise/walking, at least 150 mins/week         Musculoskeletal and Integument   Rheumatoid arthritis (HCC)    Not on any medication currently Will check CRP Obtain records from her previous Rheumatologist in [place] Used to take Prednisone for RA and Myasthenia Gravis      Relevant Orders   C-reactive protein     Other   History of myasthenia gravis    Used to take Mestinon and Prednisone, not on any medications currently Followed by Dr Gerilyn Pilgrim Requests for referral to a different Neurology for second opinion, will refer to [facility]      Relevant Orders   Ambulatory referral to Neurology   History of stroke    In 07/2020, has residual speech difficulty Previous MRI brain reviewed On Aspirin and statin Referred to Neurology      Relevant Orders   Ambulatory referral to Neurology   EKG 12-Lead (Completed)   Other Visit Diagnoses        Need for hepatitis C screening test       Relevant Orders   Hepatitis C Antibody   Encounter for screening for HIV       Relevant Orders   HIV antibody (with reflex)       Outpatient Encounter Medications as of 06/04/2021  Medication Sig   acetaminophen (TYLENOL) 325 MG tablet Tylenol 325 mg tablet  Take 2 tablets every 6 hours by  oral route as needed.   amLODipine (NORVASC) 5 MG tablet Take 5 mg by mouth daily.   aspirin EC 81 MG tablet Take 1 tablet (81 mg total) by mouth daily. Swallow whole.   atorvastatin (LIPITOR) 40 MG tablet Take 1 tablet (40 mg total) by mouth daily.   ferrous sulfate 325 (65 FE) MG tablet Take 325 mg by mouth daily with breakfast. (Patient not taking: Reported on 06/04/2021)   No facility-administered encounter medications on file as of 06/04/2021.    Follow-up: Return in about 6 weeks (around 07/16/2021) for Annual physical.   Anabel Halon, MD

## 2021-06-05 NOTE — Assessment & Plan Note (Signed)
In 07/2020, has residual speech difficulty Previous MRI brain reviewed On Aspirin and statin Referred to Neurology

## 2021-06-05 NOTE — Assessment & Plan Note (Signed)
Used to take Mestinon and Prednisone, not on any medications currently Followed by Dr Gerilyn Pilgrim Requests for referral to a different Neurology for second opinion, will refer to [facility] Neurology

## 2021-06-05 NOTE — Assessment & Plan Note (Addendum)
BP Readings from Last 1 Encounters:  06/04/21 (!) 142/84   Elevated with Amlodipine 5 mg Could be due to first visit related anxiety, will keep the same medication for now Counseled for compliance with the medications Advised DASH diet and moderate exercise/walking, at least 150 mins/week  EKG: Sinus rhythm. No signs of active ischemia.  Plan to refer to Cardiology in the next visit for evaluation of etiology of CVA

## 2021-06-19 DIAGNOSIS — H35033 Hypertensive retinopathy, bilateral: Secondary | ICD-10-CM | POA: Diagnosis not present

## 2021-06-22 ENCOUNTER — Other Ambulatory Visit (HOSPITAL_COMMUNITY): Payer: Self-pay | Admitting: Neuroradiology

## 2021-06-22 ENCOUNTER — Other Ambulatory Visit: Payer: Self-pay | Admitting: Neurology

## 2021-06-22 DIAGNOSIS — I69322 Dysarthria following cerebral infarction: Secondary | ICD-10-CM | POA: Diagnosis not present

## 2021-06-22 DIAGNOSIS — I671 Cerebral aneurysm, nonruptured: Secondary | ICD-10-CM | POA: Diagnosis not present

## 2021-06-22 DIAGNOSIS — G7000 Myasthenia gravis without (acute) exacerbation: Secondary | ICD-10-CM | POA: Diagnosis not present

## 2021-06-22 DIAGNOSIS — I10 Essential (primary) hypertension: Secondary | ICD-10-CM | POA: Diagnosis not present

## 2021-06-26 ENCOUNTER — Ambulatory Visit (HOSPITAL_COMMUNITY)
Admission: RE | Admit: 2021-06-26 | Discharge: 2021-06-26 | Disposition: A | Discharge status: Home / Self Care | Payer: BC Managed Care – PPO | Source: Ambulatory Visit | Attending: Neuroradiology | Admitting: Neuroradiology

## 2021-06-26 ENCOUNTER — Other Ambulatory Visit: Payer: Self-pay

## 2021-06-26 DIAGNOSIS — I671 Cerebral aneurysm, nonruptured: Secondary | ICD-10-CM

## 2021-06-26 DIAGNOSIS — I672 Cerebral atherosclerosis: Secondary | ICD-10-CM | POA: Diagnosis not present

## 2021-06-26 NOTE — Consult Note (Signed)
Chief Complaint: Patient was seen in consultation today for intracranial stenosis and brain aneurysm.  Supervising Physician: Baldemar Lenis  Patient Status: [facility] - Out-pt  History of Present Illness: Barbara Steele is a 60 year old female with past medical history significant for hypertension, rheumatoid arthritis, myasthenia gravis and left basal ganglia lacunar infarcts (07/2020).  She has residual dysarthria and mildly slurred.  During workup for CVA, MR angiogram of the brain showed intracranial atherosclerotic disease with severe stenosis of the bilateral P2/PCA segment and a 2 mm left M2/MCA aneurysm.  She complains of mild chronic daily headaches. She comes to our service today for evaluation of MRA findings.  She denies family history of brain aneurysm.  She does not smoke.   Past Medical History:  Diagnosis Date   Acute ischemic stroke (HCC) 07/24/2020   Arthritis    High cholesterol    Hypertension    Mental disorder    Myasthenia gravis (HCC)    Stroke [facility])     Past Surgical History:  Procedure Laterality Date   BREAST LUMPECTOMY     MASTECTOMY     thymectomy      Allergies: Patient has no known allergies.  Medications: Prior to Admission medications   Medication Sig Start Date End Date Taking? Authorizing Provider  acetaminophen (TYLENOL) 325 MG tablet Tylenol 325 mg tablet  Take 2 tablets every 6 hours by oral route as needed.    [provider]  amLODipine (NORVASC) 5 MG tablet Take 5 mg by mouth daily. 09/16/20   [provider]  aspirin EC 81 MG tablet Take 1 tablet (81 mg total) by mouth daily. Swallow whole. 07/26/20 07/26/21  Rhetta Mura, MD  atorvastatin (LIPITOR) 40 MG tablet Take 1 tablet (40 mg total) by mouth daily. 07/26/20 07/26/21  Rhetta Mura, MD  ferrous sulfate 325 (65 FE) MG tablet Take 325 mg by mouth daily with breakfast. Patient not taking: Reported on 06/04/2021    [provider]     Family History  Problem Relation Age of Onset   Heart attack Paternal Grandfather    Dementia Maternal Grandmother    Stroke Maternal Grandfather    Dementia Mother    Heart disease Brother    Cancer Sister        cervical    Social History   Socioeconomic History   Marital status: Divorced    Spouse name: Not on file   Number of children: Not on file   Years of education: Not on file   Highest education level: Not on file  Occupational History   Not on file  Tobacco Use   Smoking status: Never   Smokeless tobacco: Never  Vaping Use   Vaping Use: Never used  Substance and Sexual Activity   Alcohol use: Not Currently   Drug use: Never   Sexual activity: Not Currently    Birth control/protection: Post-menopausal  Other Topics Concern   Not on file  Social History Narrative   Not on file   Social Determinants of Health   Financial Resource Strain: Low Risk    Difficulty of Paying Living Expenses: Not hard at all  Food Insecurity: No Food Insecurity   Worried About Programme researcher, broadcasting/film/video in the Last Year: Never true   Ran Out of Food in the Last Year: Never true  Transportation Needs: No Transportation Needs   Lack of Transportation (Medical): No   Lack of Transportation (Non-Medical): No  Physical Activity: Inactive  Days of Exercise per Week: 0 days   Minutes of Exercise per Session: 0 min  Stress: Stress Concern Present   Feeling of Stress : Very much  Social Connections: Moderately Isolated   Frequency of Communication with Friends and Family: More than three times a week   Frequency of Social Gatherings with Friends and Family: Never   Attends Religious Services: 1 to 4 times per year   Active Member of [employer] or Organizations: No   Attends Banker Meetings: Never   Marital Status: Divorced     Review of Systems: A 12 point ROS discussed and pertinent positives are indicated in the HPI above.  All other systems are negative.  Review  of Systems  Vital Signs: There were no vitals taken for this visit.  Physical Exam Constitutional:      General: She is not in acute distress. Eyes:     Extraocular Movements: Extraocular movements intact.     Pupils: Pupils are equal, round, and reactive to light.  Neurological:     Mental Status: She is alert and oriented to person, place, and time.     Cranial Nerves: Dysarthria present.     Sensory: No sensory deficit.     Motor: No weakness.         Imaging: No results found.  Labs:  CBC: Recent Labs    07/24/20 1648  WBC 5.9  HGB 13.8  HCT 43.2  PLT 350    COAGS: No results for input(s): INR, APTT in the last 8760 hours.  BMP: Recent Labs    07/24/20 1648  NA 140  K 3.4*  CL 105  CO2 25  GLUCOSE 83  BUN 12  CALCIUM 9.1  CREATININE 0.84  GFRNONAA >60    LIVER FUNCTION TESTS: Recent Labs    07/24/20 1648  BILITOT 0.8  AST 18  ALT 19  ALKPHOS 130*  PROT 8.3*  ALBUMIN 3.8    TUMOR MARKERS: No results for input(s): AFPTM, CEA, CA199, CHROMGRNA in the last 8760 hours.  Assessment and Plan:  I discussed imaging findings with Mrs. Chivers.  I explained to her that the aneurysm is unlikely to be the cause of her symptoms or stroke. I also explained that the aneurysm is not well evaluated on the MRA in the like to confirm and better characterize this finding.  I suggested we performed diagnostic cerebral angiogram to evaluate intracranial atherosclerotic disease and brain aneurysm.  She agrees and would like to proceed with a diagnostic cerebral angiogram under moderate sedation.  We will reach out to her to schedule the diagnostic angiogram.   Thank you for this interesting consult.  I greatly enjoyed meeting Delle Andrzejewski and look forward to participating in their care.  A copy of this report was sent to the requesting provider on this date.  Electronically Signed: Baldemar Lenis, MD 06/26/2021, 11:45 AM   I spent a total  of  30 Minutes   in face to face in clinical consultation, greater than 50% of which was counseling/coordinating care for intracranial atherosclerotic disease and brain aneurysm.

## 2021-06-29 ENCOUNTER — Telehealth (HOSPITAL_COMMUNITY): Payer: Self-pay

## 2021-06-29 ENCOUNTER — Other Ambulatory Visit (HOSPITAL_COMMUNITY): Payer: Self-pay | Admitting: Neuroradiology

## 2021-06-29 DIAGNOSIS — I671 Cerebral aneurysm, nonruptured: Secondary | ICD-10-CM

## 2021-06-29 NOTE — Telephone Encounter (Signed)
Pt will call back to schedule diagnostic angiogram with Dr. Quay Burow when her daughter is back in town. AW

## 2021-07-15 DIAGNOSIS — H3411 Central retinal artery occlusion, right eye: Secondary | ICD-10-CM | POA: Diagnosis not present

## 2021-07-16 ENCOUNTER — Other Ambulatory Visit: Payer: Self-pay | Admitting: Radiology

## 2021-07-17 ENCOUNTER — Ambulatory Visit (HOSPITAL_COMMUNITY)
Admission: RE | Admit: 2021-07-17 | Discharge: 2021-07-17 | Disposition: A | Discharge status: Home / Self Care | Payer: BC Managed Care – PPO | Source: Ambulatory Visit | Attending: Neuroradiology | Admitting: Neuroradiology

## 2021-07-17 ENCOUNTER — Other Ambulatory Visit (HOSPITAL_COMMUNITY): Payer: Self-pay | Admitting: Neuroradiology

## 2021-07-17 ENCOUNTER — Other Ambulatory Visit: Payer: Self-pay

## 2021-07-17 DIAGNOSIS — I671 Cerebral aneurysm, nonruptured: Secondary | ICD-10-CM

## 2021-07-17 DIAGNOSIS — Z7982 Long term (current) use of aspirin: Secondary | ICD-10-CM | POA: Insufficient documentation

## 2021-07-17 DIAGNOSIS — M069 Rheumatoid arthritis, unspecified: Secondary | ICD-10-CM | POA: Insufficient documentation

## 2021-07-17 DIAGNOSIS — Z79899 Other long term (current) drug therapy: Secondary | ICD-10-CM | POA: Diagnosis not present

## 2021-07-17 DIAGNOSIS — I672 Cerebral atherosclerosis: Secondary | ICD-10-CM | POA: Diagnosis not present

## 2021-07-17 DIAGNOSIS — I10 Essential (primary) hypertension: Secondary | ICD-10-CM | POA: Insufficient documentation

## 2021-07-17 DIAGNOSIS — I6602 Occlusion and stenosis of left middle cerebral artery: Secondary | ICD-10-CM | POA: Insufficient documentation

## 2021-07-17 HISTORY — PX: IR ANGIO INTRA EXTRACRAN SEL COM CAROTID INNOMINATE UNI R MOD SED: IMG5359

## 2021-07-17 HISTORY — PX: IR 3D INDEPENDENT WKST: IMG2385

## 2021-07-17 HISTORY — PX: IR ANGIO INTRA EXTRACRAN SEL INTERNAL CAROTID UNI L MOD SED: IMG5361

## 2021-07-17 HISTORY — PX: IR ANGIO VERTEBRAL SEL VERTEBRAL UNI R MOD SED: IMG5368

## 2021-07-17 HISTORY — PX: IR US GUIDE VASC ACCESS RIGHT: IMG2390

## 2021-07-17 LAB — CBC
HCT: 42.4 % (ref 36.0–46.0)
Hemoglobin: 13.4 g/dL (ref 12.0–15.0)
MCH: 27.1 pg (ref 26.0–34.0)
MCHC: 31.6 g/dL (ref 30.0–36.0)
MCV: 85.7 fL (ref 80.0–100.0)
Platelets: 300 10*3/uL (ref 150–400)
RBC: 4.95 MIL/uL (ref 3.87–5.11)
RDW: 12.7 % (ref 11.5–15.5)
WBC: 5 10*3/uL (ref 4.0–10.5)
nRBC: 0 % (ref 0.0–0.2)

## 2021-07-17 LAB — BASIC METABOLIC PANEL
Anion gap: 8 (ref 5–15)
BUN: 7 mg/dL (ref 6–20)
CO2: 24 mmol/L (ref 22–32)
Calcium: 9.1 mg/dL (ref 8.9–10.3)
Chloride: 106 mmol/L (ref 98–111)
Creatinine, Ser: 0.73 mg/dL (ref 0.44–1.00)
GFR, Estimated: >60 mL/min (ref >60)
Glucose, Bld: 90 mg/dL (ref 70–99)
Potassium: 3.7 mmol/L (ref 3.5–5.1)
Sodium: 138 mmol/L (ref 135–145)

## 2021-07-17 LAB — PROTIME-INR
INR: 1 (ref 0.8–1.2)
Prothrombin Time: 13.1 seconds (ref 11.4–15.2)

## 2021-07-17 LAB — APTT: aPTT: 35 seconds (ref 24–36)

## 2021-07-17 MED ORDER — IOHEXOL 300 MG/ML  SOLN
100.0000 mL | Freq: Once | INTRAMUSCULAR | Status: AC | PRN
  Administered 2021-07-17: 21 mL via INTRA_ARTERIAL

## 2021-07-17 MED ORDER — MIDAZOLAM HCL 2 MG/2ML IJ SOLN
INTRAMUSCULAR | Status: AC
  Filled 2021-07-17: qty 2

## 2021-07-17 MED ORDER — NITROGLYCERIN 1 MG/10 ML FOR IR/CATH LAB
INTRA_ARTERIAL | Status: DC | PRN
  Administered 2021-07-17: 200 ug via INTRA_ARTERIAL

## 2021-07-17 MED ORDER — SODIUM CHLORIDE 0.9 % IV SOLN: INTRAVENOUS | Status: DC

## 2021-07-17 MED ORDER — LIDOCAINE HCL 1 % IJ SOLN
INTRAMUSCULAR | Status: DC | PRN
  Administered 2021-07-17: 1 mL

## 2021-07-17 MED ORDER — LIDOCAINE HCL 1 % IJ SOLN
INTRAMUSCULAR | Status: AC
  Filled 2021-07-17: qty 20

## 2021-07-17 MED ORDER — HEPARIN SODIUM (PORCINE) 1000 UNIT/ML IJ SOLN
INTRAMUSCULAR | Status: AC
  Filled 2021-07-17: qty 1

## 2021-07-17 MED ORDER — VERAPAMIL HCL 2.5 MG/ML IV SOLN
INTRA_ARTERIAL | Status: DC | PRN
  Administered 2021-07-17: 15 mL via INTRA_ARTERIAL

## 2021-07-17 MED ORDER — IOHEXOL 300 MG/ML  SOLN
100.0000 mL | Freq: Once | INTRAMUSCULAR | Status: AC | PRN
  Administered 2021-07-17: 78 mL via INTRA_ARTERIAL

## 2021-07-17 MED ORDER — MIDAZOLAM HCL 2 MG/2ML IJ SOLN
INTRAMUSCULAR | Status: DC | PRN
  Administered 2021-07-17: .5 mg via INTRAVENOUS
  Administered 2021-07-17: 1 mg via INTRAVENOUS
  Administered 2021-07-17: .5 mg via INTRAVENOUS

## 2021-07-17 MED ORDER — FENTANYL CITRATE (PF) 100 MCG/2ML IJ SOLN
INTRAMUSCULAR | Status: AC
  Filled 2021-07-17: qty 2

## 2021-07-17 MED ORDER — VERAPAMIL HCL 2.5 MG/ML IV SOLN
INTRAVENOUS | Status: AC
  Filled 2021-07-17: qty 2

## 2021-07-17 MED ORDER — NITROGLYCERIN 1 MG/10 ML FOR IR/CATH LAB
INTRA_ARTERIAL | Status: AC
  Filled 2021-07-17: qty 10

## 2021-07-17 MED ORDER — FENTANYL CITRATE (PF) 100 MCG/2ML IJ SOLN
INTRAMUSCULAR | Status: DC | PRN
  Administered 2021-07-17 (×4): 25 ug via INTRAVENOUS

## 2021-07-17 NOTE — H&P (Signed)
Chief Complaint: Dysarthria, slurred speech and intermittent headaches. Request is for cerebral angiogram  Referring Physician(s): Valeria Batman  Supervising Physician: Baldemar Lenis  Patient Status: [facility] - Out-pt  History of Present Illness: Barbara Steele is a 60 y.o. female outpatient. History of HTN, RA, MG, and left basal ganglia lacunar infarcts 10.21.21.  Patient was seen in [facility] clinic on 9.23.22 with Dr. Ainsley Spinner due to residual dysarthria, slurred speech and daily headaches.  MR Angio from 10.21.22 reads  No intracranial large vessel occlusion.Intracranial atherosclerotic disease with multifocal stenoses, most notably as follows.Severe stenosis within the distal P2 right posterior cerebral artery Severe stenosis within the proximal P2 left posterior cerebral artery.Additional multifocal high-grade stenoses more distally within the left posterior cerebral artery within the P2 segment and P2/P3 junction 2 mm superiorly projecting aneurysm arising from a proximal left M2 MCA branch vessel. Patient presents for  diagnostic cerebral angiogram to evaluate intracranial atherosclerotic disease and brain aneurysm.  Patient alert and laying in bed, calm and comfortable. Denies any fevers, headache, chest pain, SOB, cough, abdominal pain, nausea, vomiting or bleeding. Barbara Steele endorses intermittent headaches that occur most days. She describes them as "dull" slurred speech and dysarthria. She reports issues with her gait that she describes as a  muscular weakness (right sided greater than left) along with issue with perception. She states that  she " not myself". Return precautions and treatment recommendations and follow-up discussed with the patient who is agreeable with the plan.     Past Medical History:  Diagnosis Date   Acute ischemic stroke (HCC) 07/24/2020   Arthritis    High cholesterol    Hypertension    Mental disorder    Myasthenia  gravis (HCC)    Stroke [facility])     Past Surgical History:  Procedure Laterality Date   BREAST LUMPECTOMY     MASTECTOMY     thymectomy      Allergies: Patient has no known allergies.  Medications: Prior to Admission medications   Medication Sig Start Date End Date Taking? Authorizing Provider  acetaminophen (TYLENOL) 500 MG tablet Take 500 mg by mouth 2 (two) times daily as needed for moderate pain.   Yes [provider]  amLODipine (NORVASC) 5 MG tablet Take 10 mg by mouth daily. 09/16/20  Yes [provider]  aspirin EC 81 MG tablet Take 1 tablet (81 mg total) by mouth daily. Swallow whole. 07/26/20 07/26/21 Yes Rhetta Mura, MD  atorvastatin (LIPITOR) 40 MG tablet Take 1 tablet (40 mg total) by mouth daily. 07/26/20 07/26/21 Yes Rhetta Mura, MD     Family History  Problem Relation Age of Onset   Heart attack Paternal Grandfather    Dementia Maternal Grandmother    Stroke Maternal Grandfather    Dementia Mother    Heart disease Brother    Cancer Sister        cervical    Social History   Socioeconomic History   Marital status: Divorced    Spouse name: Not on file   Number of children: Not on file   Years of education: Not on file   Highest education level: Not on file  Occupational History   Not on file  Tobacco Use   Smoking status: Never   Smokeless tobacco: Never  Vaping Use   Vaping Use: Never used  Substance and Sexual Activity   Alcohol use: Not Currently   Drug use: Never   Sexual activity: Not Currently  Birth control/protection: Post-menopausal  Other Topics Concern   Not on file  Social History Narrative   Not on file   Social Determinants of Health   Financial Resource Strain: Low Risk    Difficulty of Paying Living Expenses: Not hard at all  Food Insecurity: No Food Insecurity   Worried About Programme researcher, broadcasting/film/video in the Last Year: Never true   Barista in the Last Year: Never true  Transportation  Needs: No Transportation Needs   Lack of Transportation (Medical): No   Lack of Transportation (Non-Medical): No  Physical Activity: Inactive   Days of Exercise per Week: 0 days   Minutes of Exercise per Session: 0 min  Stress: Stress Concern Present   Feeling of Stress : Very much  Social Connections: Moderately Isolated   Frequency of Communication with Friends and Family: More than three times a week   Frequency of Social Gatherings with Friends and Family: Never   Attends Religious Services: 1 to 4 times per year   Active Member of [employer] or Organizations: No   Attends Banker Meetings: Never   Marital Status: Divorced     Review of Systems: A 12 point ROS discussed and pertinent positives are indicated in the HPI above.  All other systems are negative.  Review of Systems  Constitutional:  Negative for fatigue and fever.  HENT:  Negative for congestion.   Respiratory:  Negative for cough and shortness of breath.   Gastrointestinal:  Negative for abdominal pain, diarrhea, nausea and vomiting.  Neurological:  Positive for weakness (right greater than left.) and headaches (dull. occur most days.).       Dysarthria and slurred speech.    Vital Signs: BP 130/75   Pulse 84   Temp 98.1 F (36.7 C) (Oral)   Resp 15   Ht 5\' 7"  (1.702 m)   Wt 139 lb (63 kg)   SpO2 100%   BMI 21.77 kg/m   Physical Exam Vitals and nursing note reviewed.  Constitutional:      Appearance: She is well-developed.  HENT:     Head: Normocephalic and atraumatic.  Eyes:     Conjunctiva/sclera: Conjunctivae normal.  Cardiovascular:     Rate and Rhythm: Normal rate and regular rhythm.     Heart sounds: Normal heart sounds.  Pulmonary:     Effort: Pulmonary effort is normal.     Breath sounds: Normal breath sounds.  Musculoskeletal:        General: Normal range of motion.     Cervical back: Normal range of motion.  Skin:    General: Skin is warm.  Neurological:     Mental Status:  She is alert and oriented to person, place, and time.     Comments: Alert, aware and oriented X 3 Speech and comprehension is intact.  PERRL bilaterally No facial droop noted Tongue midline Can spontaneously move all 4 extremities.  Hand grip strength left greater than right  Slurred speech Dysarthria  Speech, cognition and language  are generally intact.  Comprehension and fluency are normal.  Judgment and insight normal  Negative pronator drift. Fine motor and coordination grossly in tact Gait not assessed Romberg not assessed Heel to toe not assessed Distal pulses not assessed     Imaging: No results found.  Labs:  CBC: Recent Labs    07/24/20 1648  WBC 5.9  HGB 13.8  HCT 43.2  PLT 350    COAGS: No results for input(s):  INR, APTT in the last 8760 hours.  BMP: Recent Labs    07/24/20 1648  NA 140  K 3.4*  CL 105  CO2 25  GLUCOSE 83  BUN 12  CALCIUM 9.1  CREATININE 0.84  GFRNONAA >60    LIVER FUNCTION TESTS: Recent Labs    07/24/20 1648  BILITOT 0.8  AST 18  ALT 19  ALKPHOS 130*  PROT 8.3*  ALBUMIN 3.8    TUMOR MARKERS: No results for input(s): AFPTM, CEA, CA199, CHROMGRNA in the last 8760 hours.  Assessment and Plan:  60 y.o. female outpatient. History of HTN, RA, MG, and left basal ganglia lacunar infarcts 10.21.21.  Patient was seen in [facility] clinic on 9.23.22 with Dr. Ainsley Spinner due to residual dysarthria, slurred speech and daily headaches.  MR Angio from 10.21.22 reads  No intracranial large vessel occlusion.Intracranial atherosclerotic disease with multifocal stenoses, most notably as follows.Severe stenosis within the distal P2 right posterior cerebral artery Severe stenosis within the proximal P2 left posterior cerebral artery.Additional multifocal high-grade stenoses more distally within the left posterior cerebral artery within the P2 segment and P2/P3 junction 2 mm superiorly projecting aneurysm arising from a proximal  left M2 MCA branch vessel. Patient presents for  diagnostic cerebral angiogram to evaluate intracranial atherosclerotic disease and brain aneurysm.  All labs and medications are within acceptable parameters. NKDA. Patient has been NPO since midnight.  Risks and benefits of cerebral angio were discussed with the patient including, but not limited to bleeding, infection, vascular injury or contrast induced renal failure.  This interventional procedure involves the use of X-rays and because of the nature of the planned procedure, it is possible that we will have prolonged use of X-ray fluoroscopy.  Potential radiation risks to you include (but are not limited to) the following: - A slightly elevated risk for cancer  several years later in life. This risk is typically less than 0.5% percent. This risk is low in comparison to the normal incidence of human cancer, which is 33% for women and 50% for men according to the American Cancer Society. - Radiation induced injury can include skin redness, resembling a rash, tissue breakdown / ulcers and hair loss (which can be temporary or permanent).   The likelihood of either of these occurring depends on the difficulty of the procedure and whether you are sensitive to radiation due to previous procedures, disease, or genetic conditions.   IF your procedure requires a prolonged use of radiation, you will be notified and given written instructions for further action.  It is your responsibility to monitor the irradiated area for the 2 weeks following the procedure and to notify your physician if you are concerned that you have suffered a radiation induced injury.    All of the patient's questions were answered, patient is agreeable to proceed.  Consent signed and in chart.    Thank you for this interesting consult.  I greatly enjoyed meeting Sahej Hauswirth and look forward to participating in their care.  A copy of this report was sent to the requesting  provider on this date.  Electronically Signed: Alene Mires, NP 07/17/2021, 8:12 AM   I spent a total of  30 Minutes   in face to face in clinical consultation, greater than 50% of which was counseling/coordinating care for cerebral angiogram

## 2021-07-17 NOTE — Sedation Documentation (Signed)
SBAR called to [employer], Charity fundraiser.

## 2021-07-20 ENCOUNTER — Other Ambulatory Visit (HOSPITAL_COMMUNITY): Payer: Self-pay | Admitting: Neuroradiology

## 2021-07-20 DIAGNOSIS — I671 Cerebral aneurysm, nonruptured: Secondary | ICD-10-CM

## 2021-07-22 DIAGNOSIS — H3411 Central retinal artery occlusion, right eye: Secondary | ICD-10-CM | POA: Diagnosis not present

## 2021-07-29 ENCOUNTER — Telehealth: Payer: Self-pay | Admitting: Internal Medicine

## 2021-07-29 ENCOUNTER — Other Ambulatory Visit: Payer: Self-pay | Admitting: *Deleted

## 2021-07-29 MED ORDER — ATORVASTATIN CALCIUM 40 MG PO TABS: 40.0000 mg | ORAL_TABLET | Freq: Every day | ORAL | 11 refills | Status: DC

## 2021-07-29 NOTE — Telephone Encounter (Signed)
Medication sent to pharmacy  

## 2021-07-29 NOTE — Telephone Encounter (Signed)
Pt called in for refill on ATORVASTIN  Pharm needs verbal order [facility] [address]

## 2021-08-05 ENCOUNTER — Other Ambulatory Visit: Payer: Self-pay

## 2021-08-05 ENCOUNTER — Encounter: Payer: Self-pay | Admitting: Internal Medicine

## 2021-08-05 ENCOUNTER — Ambulatory Visit (INDEPENDENT_AMBULATORY_CARE_PROVIDER_SITE_OTHER): Payer: BC Managed Care – PPO | Admitting: Internal Medicine

## 2021-08-05 VITALS — BP 131/73 | HR 92 | Temp 98.3°F | Resp 18 | Ht 67.0 in | Wt 143.1 lb

## 2021-08-05 DIAGNOSIS — Z0001 Encounter for general adult medical examination with abnormal findings: Secondary | ICD-10-CM

## 2021-08-05 DIAGNOSIS — Z0000 Encounter for general adult medical examination without abnormal findings: Secondary | ICD-10-CM | POA: Diagnosis not present

## 2021-08-05 DIAGNOSIS — H3411 Central retinal artery occlusion, right eye: Secondary | ICD-10-CM

## 2021-08-05 DIAGNOSIS — E559 Vitamin D deficiency, unspecified: Secondary | ICD-10-CM

## 2021-08-05 DIAGNOSIS — I6529 Occlusion and stenosis of unspecified carotid artery: Secondary | ICD-10-CM

## 2021-08-05 DIAGNOSIS — Z23 Encounter for immunization: Secondary | ICD-10-CM | POA: Diagnosis not present

## 2021-08-05 DIAGNOSIS — I10 Essential (primary) hypertension: Secondary | ICD-10-CM

## 2021-08-05 DIAGNOSIS — Z1159 Encounter for screening for other viral diseases: Secondary | ICD-10-CM | POA: Diagnosis not present

## 2021-08-05 DIAGNOSIS — M069 Rheumatoid arthritis, unspecified: Secondary | ICD-10-CM | POA: Diagnosis not present

## 2021-08-05 DIAGNOSIS — Z114 Encounter for screening for human immunodeficiency virus [HIV]: Secondary | ICD-10-CM | POA: Diagnosis not present

## 2021-08-05 MED ORDER — AMLODIPINE BESYLATE 10 MG PO TABS: 10.0000 mg | ORAL_TABLET | Freq: Every day | ORAL | 1 refills | Status: DC

## 2021-08-05 NOTE — Patient Instructions (Signed)
Please continue taking medications as prescribed.  You are being scheduled to get carotid artery ultrasound.

## 2021-08-06 DIAGNOSIS — Z0001 Encounter for general adult medical examination with abnormal findings: Secondary | ICD-10-CM | POA: Insufficient documentation

## 2021-08-06 DIAGNOSIS — H3411 Central retinal artery occlusion, right eye: Secondary | ICD-10-CM | POA: Insufficient documentation

## 2021-08-06 LAB — C-REACTIVE PROTEIN: CRP: 5 mg/L (ref 0–10)

## 2021-08-06 MED ORDER — VITAMIN D (ERGOCALCIFEROL) 1.25 MG (50000 UNIT) PO CAPS: 50000.0000 [IU] | ORAL_CAPSULE | ORAL | 5 refills | Status: DC

## 2021-08-06 NOTE — Assessment & Plan Note (Signed)
BP Readings from Last 1 Encounters:  08/05/21 131/73   Elevated with Amlodipine 5 mg Counseled for compliance with the medications Advised DASH diet and moderate exercise/walking, at least 150 mins/week  Plan to refer to Cardiology in the next visit for evaluation of etiology of CVA

## 2021-08-06 NOTE — Progress Notes (Signed)
Established Patient Office Visit  Subjective:  Patient ID: Barbara Steele, female    DOB: [birth year]  Age: 60 y.o. MRN: [id number]  CC:  Chief Complaint  Patient presents with   Annual Exam    Annual exam pt saw [facility] had occlusion in right eye per eye dr needs cbc esr carotid crp and [place] and echo     HPI Barbara Steele is a 60 y.o. female with past medical history of HTN, RA, myasthenia gravis and CVA (07/2020) with residual speech difficulty who presents for annual physical.  She has not had fasting blood tests yet.  HTN: BP is well-controlled. Takes medications regularly. Patient denies headache, dizziness, chest pain, dyspnea or palpitations.  RA: Has not obtained records from Rheumatology from [facility] yet. She has not provided details of her previous Rheumatology clinic yet, but has brought details today.  She recently had IR cerebral angiograms for intracranial aneurysms. Followed by Neurology.  She received flu vaccine in the office today.  Past Medical History:  Diagnosis Date   Acute ischemic stroke ([place]) 07/24/2020   Arthritis    High cholesterol    Hypertension    Mental disorder    Myasthenia gravis ([place])    Stroke [facility])     Past Surgical History:  Procedure Laterality Date   BREAST LUMPECTOMY     IR 3D INDEPENDENT WKST  07/17/2021   IR ANGIO INTRA EXTRACRAN SEL COM CAROTID INNOMINATE UNI R MOD SED  07/17/2021   IR ANGIO INTRA EXTRACRAN SEL INTERNAL CAROTID UNI L MOD SED  07/17/2021   IR ANGIO VERTEBRAL SEL VERTEBRAL UNI R MOD SED  07/17/2021   IR US GUIDE VASC ACCESS RIGHT  07/17/2021   MASTECTOMY     thymectomy      Family History  Problem Relation Age of Onset   Heart attack Paternal Grandfather    Dementia Maternal Grandmother    Stroke Maternal Grandfather    Dementia Mother    Heart disease Brother    Cancer Sister        cervical    Social History   Socioeconomic History   Marital status: Divorced    Spouse name: Not on  file   Number of children: Not on file   Years of education: Not on file   Highest education level: Not on file  Occupational History   Not on file  Tobacco Use   Smoking status: Never   Smokeless tobacco: Never  Vaping Use   Vaping Use: Never used  Substance and Sexual Activity   Alcohol use: Not Currently   Drug use: Never   Sexual activity: Not Currently    Birth control/protection: Post-menopausal  Other Topics Concern   Not on file  Social History Narrative   Not on file   Social Determinants of Health   Financial Resource Strain: Low Risk    Difficulty of Paying Living Expenses: Not hard at all  Food Insecurity: No Food Insecurity   Worried About Charity fundraiser in the Last Year: Never true   Arboriculturist in the Last Year: Never true  Transportation Needs: No Transportation Needs   Lack of Transportation (Medical): No   Lack of Transportation (Non-Medical): No  Physical Activity: Inactive   Days of Exercise per Week: 0 days   Minutes of Exercise per Session: 0 min  Stress: Stress Concern Present   Feeling of Stress : Very much  Social Connections: Moderately Isolated   Frequency of  Communication with Friends and Family: More than three times a week   Frequency of Social Gatherings with Friends and Family: Never   Attends Religious Services: 1 to 4 times per year   Active Member of [employer] or Organizations: No   Attends Music therapist: Never   Marital Status: Divorced  Human resources officer Violence: Not At Risk   Fear of Current or Ex-Partner: No   Emotionally Abused: No   Physically Abused: No   Sexually Abused: No    Outpatient Medications Prior to Visit  Medication Sig Dispense Refill   acetaminophen (TYLENOL) 500 MG tablet Take 500 mg by mouth 2 (two) times daily as needed for moderate pain.     atorvastatin (LIPITOR) 40 MG tablet Take 1 tablet (40 mg total) by mouth daily. 30 tablet 11   amLODipine (NORVASC) 5 MG tablet Take 10 mg by  mouth daily.     No facility-administered medications prior to visit.    No Known Allergies  ROS Review of Systems  Constitutional:  Negative for chills and fever.  HENT:  Negative for congestion, sinus pressure, sinus pain and sore throat.   Eyes:  Negative for pain and discharge.  Respiratory:  Negative for cough and shortness of breath.   Cardiovascular:  Negative for chest pain and palpitations.  Gastrointestinal:  Negative for abdominal pain, constipation, diarrhea, nausea and vomiting.  Endocrine: Negative for polydipsia and polyuria.  Genitourinary:  Negative for dysuria and hematuria.  Musculoskeletal:  Positive for arthralgias and back pain. Negative for neck pain and neck stiffness.  Skin:  Negative for rash.  Neurological:  Positive for speech difficulty. Negative for dizziness, weakness and numbness.  Psychiatric/Behavioral:  Negative for agitation and behavioral problems.      Objective:    Physical Exam Vitals reviewed.  Constitutional:      General: She is not in acute distress.    Appearance: She is not diaphoretic.  HENT:     Head: Normocephalic and atraumatic.     Nose: Nose normal.     Mouth/Throat:     Mouth: Mucous membranes are moist.  Eyes:     General: No scleral icterus.    Extraocular Movements: Extraocular movements intact.  Cardiovascular:     Rate and Rhythm: Normal rate and regular rhythm.     Pulses: Normal pulses.     Heart sounds: Normal heart sounds. No murmur heard. Pulmonary:     Breath sounds: Normal breath sounds. No wheezing or rales.  Abdominal:     Palpations: Abdomen is soft.     Tenderness: There is no abdominal tenderness.  Musculoskeletal:     Cervical back: Neck supple. No tenderness.     Right lower leg: No edema.     Left lower leg: No edema.  Skin:    General: Skin is warm.     Findings: No rash.  Neurological:     General: No focal deficit present.     Mental Status: She is alert and oriented to person, place,  and time.     Cranial Nerves: Dysarthria present.     Sensory: No sensory deficit.     Motor: No weakness.  Psychiatric:        Mood and Affect: Mood normal.        Behavior: Behavior normal.    BP 131/73 (BP Location: Left Arm, Patient Position: Sitting, Cuff Size: Normal)   Pulse 92   Temp 98.3 F (36.8 C) (Oral)   Resp 18  Ht _0  (1.702 m)   Wt 143 lb 1.9 oz (64.9 kg)   SpO2 98%   BMI 22.42 kg/m  Wt Readings from Last 3 Encounters:  08/05/21 143 lb 1.9 oz (64.9 kg)  07/17/21 139 lb (63 kg)  06/04/21 139 lb 1.9 oz (63.1 kg)    Lab Results  Component Value Date   TSH 0.501 08/05/2021   Lab Results  Component Value Date   WBC 5.8 08/05/2021   HGB 12.5 08/05/2021   HCT 38.0 08/05/2021   MCV 82 08/05/2021   PLT 277 08/05/2021   Lab Results  Component Value Date   NA 143 08/05/2021   K 4.2 08/05/2021   CO2 24 08/05/2021   GLUCOSE 85 08/05/2021   BUN WILL FOLLOW 08/05/2021   CREATININE 0.73 08/05/2021   BILITOT 0.3 08/05/2021   ALKPHOS 165 (H) 08/05/2021   AST 16 08/05/2021   ALT 14 08/05/2021   PROT 8.3 08/05/2021   ALBUMIN 4.5 08/05/2021   CALCIUM 9.5 08/05/2021   ANIONGAP 8 07/17/2021   EGFR 95 08/05/2021   Lab Results  Component Value Date   CHOL 161 08/05/2021   Lab Results  Component Value Date   HDL 66 08/05/2021   Lab Results  Component Value Date   LDLCALC 79 08/05/2021   Lab Results  Component Value Date   TRIG 85 08/05/2021   Lab Results  Component Value Date   CHOLHDL 2.4 08/05/2021   Lab Results  Component Value Date   HGBA1C 5.2 08/05/2021      Assessment & Plan:   Problem List Items Addressed This Visit       Encounter for general adult medical examination with abnormal findings - Primary   Annual exam as documented. Counseling done  re healthy lifestyle involving commitment to 150 minutes exercise per week, heart healthy diet, and attaining healthy weight.The importance of adequate sleep also discussed. Changes in  health habits are decided on by the patient with goals and time frames  set for achieving them. Immunization and cancer screening needs are specifically addressed at this visit.       Cardiovascular and Mediastinum   Essential hypertension    BP Readings from Last 1 Encounters:  08/05/21 131/73  Elevated with Amlodipine 5 mg Counseled for compliance with the medications Advised DASH diet and moderate exercise/walking, at least 150 mins/week  Plan to refer to Cardiology in the next visit for evaluation of etiology of CVA       Relevant Medications   amLODipine (NORVASC) 10 MG tablet   Central retinal artery occlusion of right eye    Had Ophthalmology evaluation recently, advised to get CRP, carotid artery [place], CBC, CMP and CRP Has had Echo      Relevant Medications   amLODipine (NORVASC) 10 MG tablet   Other Relevant Orders   US Carotid Duplex Bilateral     Other         Other Visit Diagnoses     Stenosis of carotid artery, unspecified laterality       Relevant Medications   amLODipine (NORVASC) 10 MG tablet   Other Relevant Orders   US Carotid Duplex Bilateral   Need for immunization against influenza       Relevant Orders   Flu Vaccine QUAD 36moIM (Fluarix, Fluzone & Alfiuria Quad PF) (Completed)   Vitamin D deficiency       Relevant Medications   Vitamin D, Ergocalciferol, (DRISDOL) 1.25 MG (50000 UNIT) CAPS capsule  Meds ordered this encounter  Medications   amLODipine (NORVASC) 10 MG tablet    Sig: Take 1 tablet (10 mg total) by mouth daily.    Dispense:  90 tablet    Refill:  1   Vitamin D, Ergocalciferol, (DRISDOL) 1.25 MG (50000 UNIT) CAPS capsule    Sig: Take 1 capsule (50,000 Units total) by mouth every 7 (seven) days.    Dispense:  5 capsule    Refill:  5    Follow-up: Return in about 6 months (around 02/02/2022) for HTN.    Lindell Spar, MD

## 2021-08-06 NOTE — Assessment & Plan Note (Signed)
Annual exam as documented. Counseling done  re healthy lifestyle involving commitment to 150 minutes exercise per week, heart healthy diet, and attaining healthy weight.The importance of adequate sleep also discussed. Changes in health habits are decided on by the patient with goals and time frames  set for achieving them. Immunization and cancer screening needs are specifically addressed at this visit. 

## 2021-08-06 NOTE — Assessment & Plan Note (Signed)
Had Ophthalmology evaluation recently, advised to get CRP, carotid artery [place], CBC, CMP and CRP Has had Echo

## 2021-08-07 LAB — LIPID PANEL
Chol/HDL Ratio: 2.4 ratio (ref 0.0–4.4)
Cholesterol, Total: 161 mg/dL (ref 100–199)
HDL: 66 mg/dL (ref >39)
LDL Chol Calc (NIH): 79 mg/dL (ref 0–99)
Triglycerides: 85 mg/dL (ref 0–149)
VLDL Cholesterol Cal: 16 mg/dL (ref 5–40)

## 2021-08-07 LAB — CMP14+EGFR
ALT: 14 IU/L (ref 0–32)
AST: 16 IU/L (ref 0–40)
Albumin/Globulin Ratio: 1.2 (ref 1.2–2.2)
Albumin: 4.5 g/dL (ref 3.8–4.9)
Alkaline Phosphatase: 165 IU/L — ABNORMAL HIGH (ref 44–121)
BUN/Creatinine Ratio: 11 (ref 9–23)
BUN: 8 mg/dL (ref 6–24)
Bilirubin Total: 0.3 mg/dL (ref 0.0–1.2)
CO2: 24 mmol/L (ref 20–29)
Calcium: 9.5 mg/dL (ref 8.7–10.2)
Chloride: 105 mmol/L (ref 96–106)
Creatinine, Ser: 0.73 mg/dL (ref 0.57–1.00)
Globulin, Total: 3.8 g/dL (ref 1.5–4.5)
Glucose: 85 mg/dL (ref 70–99)
Potassium: 4.2 mmol/L (ref 3.5–5.2)
Sodium: 143 mmol/L (ref 134–144)
Total Protein: 8.3 g/dL (ref 6.0–8.5)
eGFR: 95 mL/min/{1.73_m2} (ref >59)

## 2021-08-07 LAB — HIV ANTIBODY (ROUTINE TESTING W REFLEX): HIV Screen 4th Generation wRfx: NONREACTIVE

## 2021-08-07 LAB — CBC WITH DIFFERENTIAL/PLATELET
Basophils Absolute: 0.1 10*3/uL (ref 0.0–0.2)
Basos: 1 %
EOS (ABSOLUTE): 0.2 10*3/uL (ref 0.0–0.4)
Eos: 3 %
Hematocrit: 38 % (ref 34.0–46.6)
Hemoglobin: 12.5 g/dL (ref 11.1–15.9)
Immature Grans (Abs): 0 10*3/uL (ref 0.0–0.1)
Immature Granulocytes: 0 %
Lymphocytes Absolute: 2.2 10*3/uL (ref 0.7–3.1)
Lymphs: 37 %
MCH: 26.9 pg (ref 26.6–33.0)
MCHC: 32.9 g/dL (ref 31.5–35.7)
MCV: 82 fL (ref 79–97)
Monocytes Absolute: 0.5 10*3/uL (ref 0.1–0.9)
Monocytes: 8 %
Neutrophils Absolute: 3 10*3/uL (ref 1.4–7.0)
Neutrophils: 51 %
Platelets: 277 10*3/uL (ref 150–450)
RBC: 4.64 x10E6/uL (ref 3.77–5.28)
RDW: 12.4 % (ref 11.7–15.4)
WBC: 5.8 10*3/uL (ref 3.4–10.8)

## 2021-08-07 LAB — HEMOGLOBIN A1C
Est. average glucose Bld gHb Est-mCnc: 103 mg/dL
Hgb A1c MFr Bld: 5.2 % (ref 4.8–5.6)

## 2021-08-07 LAB — VITAMIN D 25 HYDROXY (VIT D DEFICIENCY, FRACTURES): Vit D, 25-Hydroxy: 11.3 ng/mL — ABNORMAL LOW (ref 30.0–100.0)

## 2021-08-07 LAB — TSH: TSH: 0.501 u[IU]/mL (ref 0.450–4.500)

## 2021-08-07 LAB — HEPATITIS C ANTIBODY: Hep C Virus Ab: <0.1 s/co ratio (ref 0.0–0.9)

## 2021-08-14 ENCOUNTER — Telehealth: Payer: Self-pay | Admitting: Internal Medicine

## 2021-08-14 ENCOUNTER — Other Ambulatory Visit: Payer: Self-pay

## 2021-08-14 ENCOUNTER — Ambulatory Visit (HOSPITAL_COMMUNITY)
Admission: RE | Admit: 2021-08-14 | Discharge: 2021-08-14 | Disposition: A | Discharge status: Home / Self Care | Payer: BC Managed Care – PPO | Source: Ambulatory Visit | Attending: Internal Medicine | Admitting: Internal Medicine

## 2021-08-14 DIAGNOSIS — H3411 Central retinal artery occlusion, right eye: Secondary | ICD-10-CM | POA: Diagnosis not present

## 2021-08-14 DIAGNOSIS — I6529 Occlusion and stenosis of unspecified carotid artery: Secondary | ICD-10-CM | POA: Diagnosis not present

## 2021-08-14 DIAGNOSIS — H349 Unspecified retinal vascular occlusion: Secondary | ICD-10-CM | POA: Diagnosis not present

## 2021-08-14 DIAGNOSIS — I6523 Occlusion and stenosis of bilateral carotid arteries: Secondary | ICD-10-CM | POA: Diagnosis not present

## 2021-08-14 NOTE — Telephone Encounter (Signed)
Pt called to return phone call

## 2021-08-17 NOTE — Telephone Encounter (Signed)
Called pt back with lab results.  

## 2021-08-18 ENCOUNTER — Encounter: Payer: Self-pay | Admitting: *Deleted

## 2021-08-19 ENCOUNTER — Ambulatory Visit: Payer: BC Managed Care – PPO | Admitting: Diagnostic Neuroimaging

## 2021-08-19 ENCOUNTER — Encounter: Payer: Self-pay | Admitting: Diagnostic Neuroimaging

## 2021-08-19 VITALS — BP 129/76 | HR 78 | Ht 67.0 in | Wt 145.0 lb

## 2021-08-19 DIAGNOSIS — G7000 Myasthenia gravis without (acute) exacerbation: Secondary | ICD-10-CM

## 2021-08-19 MED ORDER — PYRIDOSTIGMINE BROMIDE 60 MG PO TABS: 60.0000 mg | ORAL_TABLET | Freq: Three times a day (TID) | ORAL | 6 refills | Status: DC

## 2021-08-19 NOTE — Progress Notes (Signed)
[facility]  PATIENT: Barbara Steele DOB: 24-Sep-1961  REFERRING CLINICIAN: Anabel Halon, MD HISTORY FROM: patient  REASON FOR VISIT: new consult   HISTORICAL  CHIEF COMPLAINT:  Chief Complaint  Patient presents with   Cerebrovascular Accident    Rm 7 with daughter Barbara Steele Pt is well, has had 2 strokes in the last yr, first one was Oct 2021. She has been having speech difficulty (ST completed), R sided weakness, some confusing, dizziness with headache and coordination is off.   She has had myasthenia gravis for 32 yrs.     HISTORY OF PRESENT ILLNESS:   60 year old female here for evaluation of increasing fatigue and weakness.  1990 patient had problems with eyes, swallowing, generalized weakness.  She was diagnosed with myasthenia gravis and treated with thymectomy.  She will was also placed on Mestinon and did well.  Years later she was diagnosed with rheumatoid arthritis and she was on prednisone from 1995 until 2000.  Eventually she was able to wean off of prednisone and managed her rheumatoid arthritis and myasthenia gravis conservatively.  2021 patient had right-sided weakness, slurred speech, fatigue.  She went to the hospital was diagnosed with left brain ischemic infarction.  She is also had a stroke to the right eye / retina.  Since then patient continues to have some issues with fatigue and weakness.  She is wondering if her myasthenia gravis has been exacerbated.  And would like to get back on Mestinon.   REVIEW OF SYSTEMS: Full 14 system review of systems performed and negative with exception of: as per HPI.  ALLERGIES: No Known Allergies  HOME MEDICATIONS: Outpatient Medications Prior to Visit  Medication Sig Dispense Refill   acetaminophen (TYLENOL) 500 MG tablet Take 500 mg by mouth 2 (two) times daily as needed for moderate pain.     amLODipine (NORVASC) 10 MG tablet Take 1 tablet (10 mg total) by mouth daily. 90 tablet 1    atorvastatin (LIPITOR) 40 MG tablet Take 1 tablet (40 mg total) by mouth daily. 30 tablet 11   Vitamin D, Ergocalciferol, (DRISDOL) 1.25 MG (50000 UNIT) CAPS capsule Take 1 capsule (50,000 Units total) by mouth every 7 (seven) days. 5 capsule 5   No facility-administered medications prior to visit.    PAST MEDICAL HISTORY: Past Medical History:  Diagnosis Date   Acute ischemic stroke (HCC) 07/24/2020   Arthritis    CVA (cerebral vascular accident) (HCC)    High cholesterol    Hypertension    Intracranial aneurysm 01/05/2021   Mental disorder    Myasthenia gravis (HCC)    Rheumatoid arthritis (HCC)    Stroke (HCC)     PAST SURGICAL HISTORY: Past Surgical History:  Procedure Laterality Date   BREAST LUMPECTOMY     IR 3D INDEPENDENT WKST  07/17/2021   IR ANGIO INTRA EXTRACRAN SEL COM CAROTID INNOMINATE UNI R MOD SED  07/17/2021   IR ANGIO INTRA EXTRACRAN SEL INTERNAL CAROTID UNI L MOD SED  07/17/2021   IR ANGIO VERTEBRAL SEL VERTEBRAL UNI R MOD SED  07/17/2021   IR US GUIDE VASC ACCESS RIGHT  07/17/2021   MASTECTOMY     thymectomy      FAMILY HISTORY: Family History  Problem Relation Age of Onset   Stroke Mother    Dementia Mother    Cancer Sister        cervical   Heart disease Brother    Dementia Maternal Grandmother    Stroke Maternal Grandfather  Heart attack Paternal Grandfather     SOCIAL HISTORY: Social History   Socioeconomic History   Marital status: Divorced    Spouse name: Not on file   Number of children: Not on file   Years of education: Not on file   Highest education level: Not on file  Occupational History   Not on file  Tobacco Use   Smoking status: Never   Smokeless tobacco: Never  Vaping Use   Vaping Use: Never used  Substance and Sexual Activity   Alcohol use: Not Currently   Drug use: Never   Sexual activity: Not Currently    Birth control/protection: Post-menopausal  Other Topics Concern   Not on file  Social History Narrative    Not on file   Social Determinants of Health   Financial Resource Strain: Low Risk    Difficulty of Paying Living Expenses: Not hard at all  Food Insecurity: No Food Insecurity   Worried About Programme researcher, broadcasting/film/video in the Last Year: Never true   Ran Out of Food in the Last Year: Never true  Transportation Needs: No Transportation Needs   Lack of Transportation (Medical): No   Lack of Transportation (Non-Medical): No  Physical Activity: Inactive   Days of Exercise per Week: 0 days   Minutes of Exercise per Session: 0 min  Stress: Stress Concern Present   Feeling of Stress : Very much  Social Connections: Moderately Isolated   Frequency of Communication with Friends and Family: More than three times a week   Frequency of Social Gatherings with Friends and Family: Never   Attends Religious Services: 1 to 4 times per year   Active Member of [employer] or Organizations: No   Attends Banker Meetings: Never   Marital Status: Divorced  Catering manager Violence: Not At Risk   Fear of Current or Ex-Partner: No   Emotionally Abused: No   Physically Abused: No   Sexually Abused: No     PHYSICAL EXAM  GENERAL EXAM/CONSTITUTIONAL: Vitals:  Vitals:   08/19/21 0949  BP: 129/76  Pulse: 78  Weight: 145 lb (65.8 kg)  Height: 5\' 7"  (1.702 m)   Body mass index is 22.71 kg/m. Wt Readings from Last 3 Encounters:  08/19/21 145 lb (65.8 kg)  08/05/21 143 lb 1.9 oz (64.9 kg)  07/17/21 139 lb (63 kg)   Patient is in no distress; well developed, nourished and groomed; neck is supple  CARDIOVASCULAR: Examination of carotid arteries is normal; no carotid bruits Regular rate and rhythm, no murmurs Examination of peripheral vascular system by observation and palpation is normal  EYES: Ophthalmoscopic exam of optic discs and posterior segments is normal; no papilledema or hemorrhages No results found.  MUSCULOSKELETAL: Gait, strength, tone, movements noted in Neurologic exam  below  NEUROLOGIC: MENTAL STATUS:  No flowsheet data found. awake, alert, oriented to person, place and time recent and remote memory intact normal attention and concentration language fluent, comprehension intact, naming intact fund of knowledge appropriate  CRANIAL NERVE:  2nd - no papilledema on fundoscopic exam 2nd, 3rd, 4th, 6th - pupils equal and reactive to light, visual fields full to confrontation, extraocular muscles intact, no nystagmus 5th - facial sensation symmetric 7th - facial strength symmetric 8th - hearing intact 9th - palate elevates symmetrically, uvula midline 11th - shoulder shrug symmetric 12th - tongue protrusion midline MILD SLURRED SPEECH  MOTOR:  normal bulk and tone, full strength in the BUE, BLE  SENSORY:  normal and symmetric to  light touch, temperature, vibration  COORDINATION:  finger-nose-finger, fine finger movements normal  REFLEXES:  deep tendon reflexes 1+ and symmetric  GAIT/STATION:  narrow based gait     DIAGNOSTIC DATA (LABS, IMAGING, TESTING) - I reviewed patient records, labs, notes, testing and imaging myself where available.  Lab Results  Component Value Date   WBC 5.8 08/05/2021   HGB 12.5 08/05/2021   HCT 38.0 08/05/2021   MCV 82 08/05/2021   PLT 277 08/05/2021      Component Value Date/Time   NA 143 08/05/2021 1616   K 4.2 08/05/2021 1616   CL 105 08/05/2021 1616   CO2 24 08/05/2021 1616   GLUCOSE 85 08/05/2021 1616   GLUCOSE 90 07/17/2021 0845   BUN 8 08/05/2021 1616   CREATININE 0.73 08/05/2021 1616   CALCIUM 9.5 08/05/2021 1616   PROT 8.3 08/05/2021 1616   ALBUMIN 4.5 08/05/2021 1616   AST 16 08/05/2021 1616   ALT 14 08/05/2021 1616   ALKPHOS 165 (H) 08/05/2021 1616   BILITOT 0.3 08/05/2021 1616   GFRNONAA >60 07/17/2021 0845   Lab Results  Component Value Date   CHOL 161 08/05/2021   HDL 66 08/05/2021   LDLCALC 79 08/05/2021   TRIG 85 08/05/2021   CHOLHDL 2.4 08/05/2021   Lab Results   Component Value Date   HGBA1C 5.2 08/05/2021   No results found for: VITAMINB12 Lab Results  Component Value Date   TSH 0.501 08/05/2021    07/24/20 MRI / MRA head MRI brain: 1. 2.3 cm acute/early subacute infarct within the left corona radiata/lentiform nucleus. 2. Background mild cerebral white matter chronic small vessel ischemic disease.   MRA head: 1. No intracranial large vessel occlusion. 2. Intracranial atherosclerotic disease with multifocal stenoses, most notably as follows. 3. Severe stenosis within the distal P2 right posterior cerebral artery. 4. Severe stenosis within the proximal P2 left posterior cerebral artery. 5. Additional multifocal high-grade stenoses more distally within the left posterior cerebral artery within the P2 segment and P2/P3 junction. 6. 2 mm superiorly projecting aneurysm arising from a proximal left M2 MCA branch vessel.  07/17/21 cerebral angiogram 1. No evidence of intracranial aneurysm. Image described as an aneurysm on prior MR angiogram appear to correspond to a mildly ectatic vessel loop at the left M2/MCA superior division branching point. 2. Intracranial atherosclerotic disease with mild stenosis at the right P1/PCA segment, moderate stenosis at the left P1/PCA segment, moderate stenosis at the bilateral P2/PCA segments of and at the left P3/PCA segment. 3. Moderate stenosis at the origin of the left MCA anterior temporal branch and at the origin of a branching vessel from the left MCA superior division. 4. Mild stenosis of the mid right M2/MCA posterior division branch and short segment of severe stenosis at a distal right M3/MCA superior division branch.  08/14/21 carotid u/s - Less than 50% stenosis of the internal carotid arteries.   ASSESSMENT AND PLAN  60 y.o. year old female here with:   Dx:  1. Myasthenia gravis (HCC)       PLAN:  MYASTHENIA GRAVIS (generalized; s/p thymectomy in 1990's) - start  pyridostigmine 60mg  2-3 times a day   STROKE (accelerated intracranial atherosclerosis; likely related to longstanding undiagnosed hypertension; BP 207/128 on 07/24/20 stroke admission; no smoking history; also could be related to rheumatoid arthritis or other genetic factors) - continue aspirin 81mg  , amlodipine 10mg , atorvastatin 40mg  daily  Meds ordered this encounter  Medications   pyridostigmine (MESTINON) 60 MG tablet  Sig: Take 1 tablet (60 mg total) by mouth 3 (three) times daily.    Dispense:  90 tablet    Refill:  6   Return in about 6 months (around 02/16/2022).    Suanne Marker, MD 08/19/2021, 11:06 AM Certified in Neurology, Neurophysiology and Neuroimaging  [facility] [address], [address] [place], [place] [address] [phone]

## 2021-08-19 NOTE — Patient Instructions (Signed)
  MYASTHENIA GRAVIS - trial of pyridostigmine 60mg  three times a day   STROKE (accelerated intracranial atherosclerosis; likely related to longstanding undiagnosed hypertension; BP 207/128 on 07/24/20 stroke admission; no smoking history; also could be related to rheumatoid arthritis or other genetic factors) - continue aspirin 81mg  , amlodipine 10mg , atorvastatin 40mg  daily

## 2021-10-28 DIAGNOSIS — H3411 Central retinal artery occlusion, right eye: Secondary | ICD-10-CM | POA: Diagnosis not present

## 2021-12-21 DIAGNOSIS — G7000 Myasthenia gravis without (acute) exacerbation: Secondary | ICD-10-CM | POA: Diagnosis not present

## 2021-12-21 DIAGNOSIS — M069 Rheumatoid arthritis, unspecified: Secondary | ICD-10-CM | POA: Diagnosis not present

## 2021-12-21 DIAGNOSIS — I69322 Dysarthria following cerebral infarction: Secondary | ICD-10-CM | POA: Diagnosis not present

## 2021-12-21 DIAGNOSIS — I10 Essential (primary) hypertension: Secondary | ICD-10-CM | POA: Diagnosis not present

## 2021-12-25 DIAGNOSIS — H2511 Age-related nuclear cataract, right eye: Secondary | ICD-10-CM | POA: Diagnosis not present

## 2022-01-02 HISTORY — PX: CATARACT EXTRACTION: SUR2

## 2022-01-27 DIAGNOSIS — H2511 Age-related nuclear cataract, right eye: Secondary | ICD-10-CM | POA: Diagnosis not present

## 2022-01-29 ENCOUNTER — Other Ambulatory Visit: Payer: Self-pay | Admitting: Internal Medicine

## 2022-01-29 DIAGNOSIS — I10 Essential (primary) hypertension: Secondary | ICD-10-CM

## 2022-02-02 ENCOUNTER — Ambulatory Visit: Payer: BC Managed Care – PPO | Admitting: Internal Medicine

## 2022-02-03 DIAGNOSIS — I69322 Dysarthria following cerebral infarction: Secondary | ICD-10-CM | POA: Diagnosis not present

## 2022-02-03 DIAGNOSIS — G7000 Myasthenia gravis without (acute) exacerbation: Secondary | ICD-10-CM | POA: Diagnosis not present

## 2022-02-03 DIAGNOSIS — I10 Essential (primary) hypertension: Secondary | ICD-10-CM | POA: Diagnosis not present

## 2022-02-16 ENCOUNTER — Encounter: Payer: Self-pay | Admitting: Diagnostic Neuroimaging

## 2022-02-16 ENCOUNTER — Ambulatory Visit: Payer: BC Managed Care – PPO | Admitting: Diagnostic Neuroimaging

## 2022-02-16 VITALS — BP 139/80 | HR 72 | Ht 67.0 in | Wt 160.0 lb

## 2022-02-16 DIAGNOSIS — G7000 Myasthenia gravis without (acute) exacerbation: Secondary | ICD-10-CM

## 2022-02-16 MED ORDER — PYRIDOSTIGMINE BROMIDE 60 MG PO TABS: 60.0000 mg | ORAL_TABLET | Freq: Three times a day (TID) | ORAL | 4 refills | Status: DC

## 2022-02-16 NOTE — Progress Notes (Signed)
? ?[facility] ? ?PATIENT: Barbara Steele ?DOB: 20-Dec-1960 ? ?REFERRING CLINICIAN: Anabel Halon, MD ?HISTORY FROM: patient  ?REASON FOR VISIT: follow up  ? ? ?HISTORICAL ? ?CHIEF COMPLAINT:  ?Chief Complaint  ?Patient presents with  ? Follow-up  ?  Rm 7 with daughter Elonda Husky  ?Pt is well and stable, reports things are the same as last visit. No changes or new concerns   ? ? ?HISTORY OF PRESENT ILLNESS:  ? ?UPDATE (02/16/22, [facility]P): Since last visit, doing about the same. Mestinon helping a little bit with stamina. No other new issues.  ? ?PRIOR HPI: 61 year old female here for evaluation of increasing fatigue and weakness. ? ?1990 patient had problems with eyes, swallowing, generalized weakness.  She was diagnosed with myasthenia gravis and treated with thymectomy.  She will was also placed on Mestinon and did well.  Years later she was diagnosed with rheumatoid arthritis and she was on prednisone from 1995 until 2000.  Eventually she was able to wean off of prednisone and managed her rheumatoid arthritis and myasthenia gravis conservatively. ? ?2021 patient had right-sided weakness, slurred speech, fatigue.  She went to the hospital was diagnosed with left brain ischemic infarction.  She is also had a stroke to the right eye / retina. ? ?Since then patient continues to have some issues with fatigue and weakness.  She is wondering if her myasthenia gravis has been exacerbated.  And would like to get back on Mestinon. ? ? ?REVIEW OF SYSTEMS: Full 14 system review of systems performed and negative with exception of: as per HPI. ? ?ALLERGIES: ?No Known Allergies ? ?HOME MEDICATIONS: ?Outpatient Medications Prior to Visit  ?Medication Sig Dispense Refill  ? acetaminophen (TYLENOL) 500 MG tablet Take 500 mg by mouth 2 (two) times daily as needed for moderate pain.    ? amLODipine (NORVASC) 10 MG tablet TAKE 1 TABLET(10 MG) BY MOUTH DAILY 90 tablet 1  ? aspirin EC 81 MG tablet Take 81 mg by mouth  daily. Swallow whole.    ? atorvastatin (LIPITOR) 40 MG tablet Take 1 tablet (40 mg total) by mouth daily. 30 tablet 11  ? Vitamin D, Ergocalciferol, (DRISDOL) 1.25 MG (50000 UNIT) CAPS capsule Take 1 capsule (50,000 Units total) by mouth every 7 (seven) days. 5 capsule 5  ? pyridostigmine (MESTINON) 60 MG tablet Take 1 tablet (60 mg total) by mouth 3 (three) times daily. 90 tablet 6  ? ?No facility-administered medications prior to visit.  ? ? ?PAST MEDICAL HISTORY: ?Past Medical History:  ?Diagnosis Date  ? Acute ischemic stroke (HCC) 07/24/2020  ? Arthritis   ? CVA (cerebral vascular accident) [facility])   ? High cholesterol   ? Hypertension   ? Intracranial aneurysm 01/05/2021  ? Mental disorder   ? Myasthenia gravis (HCC)   ? Rheumatoid arthritis (HCC)   ? Stroke [facility])   ? ? ?PAST SURGICAL HISTORY: ?Past Surgical History:  ?Procedure Laterality Date  ? BREAST LUMPECTOMY    ? CATARACT EXTRACTION Bilateral 01/2022  ? IR 3D INDEPENDENT WKST  07/17/2021  ? IR ANGIO INTRA EXTRACRAN SEL COM CAROTID INNOMINATE UNI R MOD SED  07/17/2021  ? IR ANGIO INTRA EXTRACRAN SEL INTERNAL CAROTID UNI L MOD SED  07/17/2021  ? IR ANGIO VERTEBRAL SEL VERTEBRAL UNI R MOD SED  07/17/2021  ? IR US GUIDE VASC ACCESS RIGHT  07/17/2021  ? MASTECTOMY    ? thymectomy    ? ? ?FAMILY HISTORY: ?Family History  ?Problem Relation Age of  Onset  ? Stroke Mother   ? Dementia Mother   ? Cancer Sister   ?     cervical  ? Heart disease Brother   ? Dementia Maternal Grandmother   ? Stroke Maternal Grandfather   ? Heart attack Paternal Grandfather   ? ? ?SOCIAL HISTORY: ?Social History  ? ?Socioeconomic History  ? Marital status: Divorced  ?  Spouse name: Not on file  ? Number of children: Not on file  ? Years of education: Not on file  ? Highest education level: Not on file  ?Occupational History  ? Not on file  ?Tobacco Use  ? Smoking status: Never  ? Smokeless tobacco: Never  ?Vaping Use  ? Vaping Use: Never used  ?Substance and Sexual Activity  ? Alcohol  use: Not Currently  ? Drug use: Never  ? Sexual activity: Not Currently  ?  Birth control/protection: Post-menopausal  ?Other Topics Concern  ? Not on file  ?Social History Narrative  ? Not on file  ? ?Social Determinants of Health  ? ?Financial Resource Strain: Not on file  ?Food Insecurity: Not on file  ?Transportation Needs: Not on file  ?Physical Activity: Not on file  ?Stress: Not on file  ?Social Connections: Not on file  ?Intimate Partner Violence: Not on file  ? ? ? ?PHYSICAL EXAM ? ?GENERAL EXAM/CONSTITUTIONAL: ?Vitals:  ?Vitals:  ? 02/16/22 1041  ?BP: 139/80  ?Pulse: 72  ?Weight: 160 lb (72.6 kg)  ?Height:  (1.702 m)  ? ?Body mass index is 25.06 kg/m?. ?Wt Readings from Last 3 Encounters:  ?02/16/22 160 lb (72.6 kg)  ?08/19/21 145 lb (65.8 kg)  ?08/05/21 143 lb 1.9 oz (64.9 kg)  ? ?Patient is in no distress; well developed, nourished and groomed; neck is supple ? ?CARDIOVASCULAR: ?Examination of carotid arteries is normal; no carotid bruits ?Regular rate and rhythm, no murmurs ?Examination of peripheral vascular system by observation and palpation is normal ? ?EYES: ?Ophthalmoscopic exam of optic discs and posterior segments is normal; no papilledema or hemorrhages ?No results found. ? ?MUSCULOSKELETAL: ?Gait, strength, tone, movements noted in Neurologic exam below ? ?NEUROLOGIC: ?MENTAL STATUS:  ?   ? View : No data to display.  ?  ?  ?  ? ?awake, alert, oriented to person, place and time ?recent and remote memory intact ?normal attention and concentration ?language fluent, comprehension intact, naming intact ?fund of knowledge appropriate ? ?CRANIAL NERVE:  ?2nd - no papilledema on fundoscopic exam ?2nd, 3rd, 4th, 6th - pupils equal and reactive to light, visual fields full to confrontation, extraocular muscles intact, no nystagmus ?5th - facial sensation symmetric ?7th - facial strength symmetric; EXCEPT SLIGHT DECR NL FOLD ON RIGHT ?8th - hearing intact ?9th - palate elevates symmetrically, uvula  midline ?11th - shoulder shrug symmetric ?12th - tongue protrusion midline ?MILD SLURRED SPEECH ? ?MOTOR:  ?normal bulk and tone, full strength in the BUE, BLE; EXCEPT SLIGHT HESITATION IN RUE AND RLE ? ?SENSORY:  ?normal and symmetric to light touch, temperature, vibration ? ?COORDINATION:  ?finger-nose-finger, fine finger movements normal ? ?REFLEXES:  ?deep tendon reflexes 1+ and symmetric ? ?GAIT/STATION:  ?narrow based gait ? ? ? ? ?DIAGNOSTIC DATA (LABS, IMAGING, TESTING) ?- I reviewed patient records, labs, notes, testing and imaging myself where available. ? ?Lab Results  ?Component Value Date  ? WBC 5.8 08/05/2021  ? HGB 12.5 08/05/2021  ? HCT 38.0 08/05/2021  ? MCV 82 08/05/2021  ? PLT 277 08/05/2021  ? ?   ?  Component Value Date/Time  ? NA 143 08/05/2021 1616  ? K 4.2 08/05/2021 1616  ? CL 105 08/05/2021 1616  ? CO2 24 08/05/2021 1616  ? GLUCOSE 85 08/05/2021 1616  ? GLUCOSE 90 07/17/2021 0845  ? BUN 8 08/05/2021 1616  ? CREATININE 0.73 08/05/2021 1616  ? CALCIUM 9.5 08/05/2021 1616  ? PROT 8.3 08/05/2021 1616  ? ALBUMIN 4.5 08/05/2021 1616  ? AST 16 08/05/2021 1616  ? ALT 14 08/05/2021 1616  ? ALKPHOS 165 (H) 08/05/2021 1616  ? BILITOT 0.3 08/05/2021 1616  ? GFRNONAA >60 07/17/2021 0845  ? ?Lab Results  ?Component Value Date  ? CHOL 161 08/05/2021  ? HDL 66 08/05/2021  ? LDLCALC 79 08/05/2021  ? TRIG 85 08/05/2021  ? CHOLHDL 2.4 08/05/2021  ? ?Lab Results  ?Component Value Date  ? HGBA1C 5.2 08/05/2021  ? ?No results found for: VITAMINB12 ?Lab Results  ?Component Value Date  ? TSH 0.501 08/05/2021  ? ? ?07/24/20 MRI / MRA head ?MRI brain: ?1. 2.3 cm acute/early subacute infarct within the left corona ?radiata/lentiform nucleus. ?2. Background mild cerebral white matter chronic small vessel ?ischemic disease. ?  ?MRA head: ?1. No intracranial large vessel occlusion. ?2. Intracranial atherosclerotic disease with multifocal stenoses, ?most notably as follows. ?3. Severe stenosis within the distal P2 right  posterior cerebral ?artery. ?4. Severe stenosis within the proximal P2 left posterior cerebral ?artery. ?5. Additional multifocal high-grade stenoses more distally within ?the left posterior cerebral arter

## 2022-02-26 ENCOUNTER — Encounter: Payer: Self-pay | Admitting: Internal Medicine

## 2022-02-26 ENCOUNTER — Ambulatory Visit: Payer: BC Managed Care – PPO | Admitting: Internal Medicine

## 2022-02-26 VITALS — BP 136/80 | HR 87 | Ht 67.0 in | Wt 160.4 lb

## 2022-02-26 DIAGNOSIS — G7000 Myasthenia gravis without (acute) exacerbation: Secondary | ICD-10-CM

## 2022-02-26 DIAGNOSIS — M069 Rheumatoid arthritis, unspecified: Secondary | ICD-10-CM

## 2022-02-26 DIAGNOSIS — Z8673 Personal history of transient ischemic attack (TIA), and cerebral infarction without residual deficits: Secondary | ICD-10-CM | POA: Diagnosis not present

## 2022-02-26 DIAGNOSIS — I10 Essential (primary) hypertension: Secondary | ICD-10-CM | POA: Diagnosis not present

## 2022-02-26 DIAGNOSIS — Z23 Encounter for immunization: Secondary | ICD-10-CM

## 2022-02-26 DIAGNOSIS — E559 Vitamin D deficiency, unspecified: Secondary | ICD-10-CM

## 2022-02-26 NOTE — Assessment & Plan Note (Signed)
Not on any medication currently Obtain records from her previous Rheumatologist in [place] Used to take Prednisone for RA and Myasthenia Gravis Will check ANA, RF and Anti-CCP

## 2022-02-26 NOTE — Progress Notes (Signed)
Established Patient Office Visit  Subjective:  Patient ID: Barbara Steele, female    DOB: [birth year]  Age: 61 y.o. MRN: [id number]  CC:  Chief Complaint  Patient presents with   Follow-up    Follow up having cataract surgery soon.    HPI Barbara Steele is a 61 y.o. female with past medical history of HTN, RA, myasthenia gravis and CVA (07/2020) with residual speech difficulty who presents for f/u of her chronic medical conditions.  HTN: BP is well-controlled. Takes medications regularly. Patient denies headache, dizziness, chest pain, dyspnea or palpitations.   RA: Has not obtained records from Rheumatology from [place] yet. She has not provided details of her previous Rheumatology clinic yet.  Myasthenia gravis: She has started taking Mestinon.  Followed by neurology.  She received first dose of Shingrix vaccine today.     Past Medical History:  Diagnosis Date   Acute ischemic stroke ([place]) 07/24/2020   Arthritis    CVA (cerebral vascular accident) ([place])    High cholesterol    Hypertension    Intracranial aneurysm 01/05/2021   Mental disorder    Myasthenia gravis ([place])    Rheumatoid arthritis ([place])    Stroke [facility])     Past Surgical History:  Procedure Laterality Date   BREAST LUMPECTOMY     CATARACT EXTRACTION Bilateral 01/2022   IR 3D INDEPENDENT WKST  07/17/2021   IR ANGIO INTRA EXTRACRAN SEL COM CAROTID INNOMINATE UNI R MOD SED  07/17/2021   IR ANGIO INTRA EXTRACRAN SEL INTERNAL CAROTID UNI L MOD SED  07/17/2021   IR ANGIO VERTEBRAL SEL VERTEBRAL UNI R MOD SED  07/17/2021   IR US GUIDE VASC ACCESS RIGHT  07/17/2021   MASTECTOMY     thymectomy      Family History  Problem Relation Age of Onset   Stroke Mother    Dementia Mother    Cancer Sister        cervical   Heart disease Brother    Dementia Maternal Grandmother    Stroke Maternal Grandfather    Heart attack Paternal Grandfather     Social History   Socioeconomic History   Marital status:  Divorced    Spouse name: Not on file   Number of children: Not on file   Years of education: Not on file   Highest education level: Not on file  Occupational History   Not on file  Tobacco Use   Smoking status: Never   Smokeless tobacco: Never  Vaping Use   Vaping Use: Never used  Substance and Sexual Activity   Alcohol use: Not Currently   Drug use: Never   Sexual activity: Not Currently    Birth control/protection: Post-menopausal  Other Topics Concern   Not on file  Social History Narrative   Not on file   Social Determinants of Health   Financial Resource Strain: Not on file  Food Insecurity: Not on file  Transportation Needs: Not on file  Physical Activity: Not on file  Stress: Not on file  Social Connections: Not on file  Intimate Partner Violence: Not on file    Outpatient Medications Prior to Visit  Medication Sig Dispense Refill   acetaminophen (TYLENOL) 500 MG tablet Take 500 mg by mouth 2 (two) times daily as needed for moderate pain.     amLODipine (NORVASC) 10 MG tablet TAKE 1 TABLET(10 MG) BY MOUTH DAILY 90 tablet 1   aspirin EC 81 MG tablet Take 81 mg by mouth daily. Swallow whole.  atorvastatin (LIPITOR) 40 MG tablet Take 1 tablet (40 mg total) by mouth daily. 30 tablet 11   pyridostigmine (MESTINON) 60 MG tablet Take 1 tablet (60 mg total) by mouth 3 (three) times daily. 270 tablet 4   Vitamin D, Ergocalciferol, (DRISDOL) 1.25 MG (50000 UNIT) CAPS capsule Take 1 capsule (50,000 Units total) by mouth every 7 (seven) days. 5 capsule 5   No facility-administered medications prior to visit.    No Known Allergies  ROS Review of Systems  Constitutional:  Negative for chills and fever.  HENT:  Negative for congestion, sinus pressure, sinus pain and sore throat.   Eyes:  Positive for visual disturbance. Negative for pain and discharge.  Respiratory:  Negative for cough and shortness of breath.   Cardiovascular:  Negative for chest pain and  palpitations.  Gastrointestinal:  Negative for abdominal pain, constipation, diarrhea, nausea and vomiting.  Endocrine: Negative for polydipsia and polyuria.  Genitourinary:  Negative for dysuria and hematuria.  Musculoskeletal:  Positive for arthralgias and back pain. Negative for neck pain and neck stiffness.  Skin:  Negative for rash.  Neurological:  Positive for speech difficulty. Negative for dizziness, weakness and numbness.  Psychiatric/Behavioral:  Negative for agitation and behavioral problems.      Objective:    Physical Exam Vitals reviewed.  Constitutional:      General: She is not in acute distress.    Appearance: She is not diaphoretic.  HENT:     Head: Normocephalic and atraumatic.     Nose: Nose normal.     Mouth/Throat:     Mouth: Mucous membranes are moist.  Eyes:     General: No scleral icterus.    Extraocular Movements: Extraocular movements intact.  Cardiovascular:     Rate and Rhythm: Normal rate and regular rhythm.     Pulses: Normal pulses.     Heart sounds: Normal heart sounds. No murmur heard. Pulmonary:     Breath sounds: Normal breath sounds. No wheezing or rales.  Abdominal:     Palpations: Abdomen is soft.     Tenderness: There is no abdominal tenderness.  Musculoskeletal:     Cervical back: Neck supple. No tenderness.     Right lower leg: No edema.     Left lower leg: No edema.  Skin:    General: Skin is warm.     Findings: No rash.  Neurological:     General: No focal deficit present.     Mental Status: She is alert and oriented to person, place, and time.     Cranial Nerves: Dysarthria present.     Sensory: No sensory deficit.     Motor: No weakness.  Psychiatric:        Mood and Affect: Mood normal.        Behavior: Behavior normal.    BP 136/80 (BP Location: Left Arm, Cuff Size: Normal)   Pulse 87   Ht _0  (1.702 m)   Wt 160 lb 6.4 oz (72.8 kg)   SpO2 95%   BMI 25.12 kg/m  Wt Readings from Last 3 Encounters:  02/26/22  160 lb 6.4 oz (72.8 kg)  02/16/22 160 lb (72.6 kg)  08/19/21 145 lb (65.8 kg)    Lab Results  Component Value Date   TSH 0.501 08/05/2021   Lab Results  Component Value Date   WBC 5.8 08/05/2021   HGB 12.5 08/05/2021   HCT 38.0 08/05/2021   MCV 82 08/05/2021   PLT 277 08/05/2021  Lab Results  Component Value Date   NA 143 08/05/2021   K 4.2 08/05/2021   CO2 24 08/05/2021   GLUCOSE 85 08/05/2021   BUN 8 08/05/2021   CREATININE 0.73 08/05/2021   BILITOT 0.3 08/05/2021   ALKPHOS 165 (H) 08/05/2021   AST 16 08/05/2021   ALT 14 08/05/2021   PROT 8.3 08/05/2021   ALBUMIN 4.5 08/05/2021   CALCIUM 9.5 08/05/2021   ANIONGAP 8 07/17/2021   EGFR 95 08/05/2021   Lab Results  Component Value Date   CHOL 161 08/05/2021   Lab Results  Component Value Date   HDL 66 08/05/2021   Lab Results  Component Value Date   LDLCALC 79 08/05/2021   Lab Results  Component Value Date   TRIG 85 08/05/2021   Lab Results  Component Value Date   CHOLHDL 2.4 08/05/2021   Lab Results  Component Value Date   HGBA1C 5.2 08/05/2021      Assessment & Plan:   Problem List Items Addressed This Visit       Cardiovascular and Mediastinum   Essential hypertension    BP Readings from Last 1 Encounters:  02/26/22 136/80  Elevated with Amlodipine 5 mg Counseled for compliance with the medications Advised DASH diet and moderate exercise/walking, at least 150 mins/week      Relevant Orders   Basic Metabolic Panel (BMET)     Nervous and Auditory   Myasthenia gravis without acute exacerbation ([place])    On Mestinon now Followed by Neurology         Musculoskeletal and Integument   Rheumatoid arthritis ([place]) - Primary    Not on any medication currently Obtain records from her previous Rheumatologist in [place] to take Prednisone for RA and Myasthenia Gravis Will check ANA, RF and Anti-CCP       Relevant Orders   Basic Metabolic Panel (BMET)   Rheumatoid Factor   ANA  Direct w/Reflex if Positive   CYCLIC CITRUL PEPTIDE ANTIBODY, IGG/IGA     Other   History of stroke    In 07/2020, has residual speech difficulty Previous MRI brain reviewed On Aspirin and statin Followed by Neurology       Vitamin D deficiency    On Vitamin D supplement       Other Visit Diagnoses     Immunization due       Relevant Orders   Varicella-zoster vaccine IM (Completed)       No orders of the defined types were placed in this encounter.   Follow-up: Return in about 6 months (around 08/29/2022) for Annual physical.    Lindell Spar, MD

## 2022-02-26 NOTE — Assessment & Plan Note (Signed)
On Mestinon now Followed by Neurology 

## 2022-02-26 NOTE — Assessment & Plan Note (Signed)
BP Readings from Last 1 Encounters:  02/26/22 136/80   Elevated with Amlodipine 5 mg Counseled for compliance with the medications Advised DASH diet and moderate exercise/walking, at least 150 mins/week

## 2022-02-26 NOTE — Patient Instructions (Signed)
Please continue taking medications as prescribed.  Please continue to follow low salt diet and perform moderate exercise/walking at least 150 mins/week. 

## 2022-02-26 NOTE — Assessment & Plan Note (Signed)
In 07/2020, has residual speech difficulty Previous MRI brain reviewed On Aspirin and statin Followed by Neurology 

## 2022-02-26 NOTE — Assessment & Plan Note (Signed)
On Vitamin D supplement

## 2022-03-03 ENCOUNTER — Other Ambulatory Visit: Payer: Self-pay | Admitting: Internal Medicine

## 2022-03-03 DIAGNOSIS — M069 Rheumatoid arthritis, unspecified: Secondary | ICD-10-CM

## 2022-03-03 LAB — BASIC METABOLIC PANEL
BUN/Creatinine Ratio: 11 (ref 9–23)
BUN: 9 mg/dL (ref 6–24)
CO2: 20 mmol/L (ref 20–29)
Calcium: 9.5 mg/dL (ref 8.7–10.2)
Chloride: 107 mmol/L — ABNORMAL HIGH (ref 96–106)
Creatinine, Ser: 0.79 mg/dL (ref 0.57–1.00)
Glucose: 106 mg/dL — ABNORMAL HIGH (ref 70–99)
Potassium: 4.4 mmol/L (ref 3.5–5.2)
Sodium: 145 mmol/L — ABNORMAL HIGH (ref 134–144)
eGFR: 86 mL/min/{1.73_m2} (ref >59)

## 2022-03-03 LAB — CYCLIC CITRUL PEPTIDE ANTIBODY, IGG/IGA: Cyclic Citrullin Peptide Ab: >250 units — ABNORMAL HIGH (ref 0–19)

## 2022-03-03 LAB — RHEUMATOID FACTOR: Rheumatoid fact SerPl-aCnc: 13.7 IU/mL (ref <14.0)

## 2022-03-03 LAB — ANA W/REFLEX IF POSITIVE: Anti Nuclear Antibody (ANA): NEGATIVE

## 2022-03-17 DIAGNOSIS — H269 Unspecified cataract: Secondary | ICD-10-CM | POA: Diagnosis not present

## 2022-03-17 DIAGNOSIS — H2512 Age-related nuclear cataract, left eye: Secondary | ICD-10-CM | POA: Diagnosis not present

## 2022-03-17 DIAGNOSIS — I10 Essential (primary) hypertension: Secondary | ICD-10-CM | POA: Diagnosis not present

## 2022-04-28 DIAGNOSIS — M25531 Pain in right wrist: Secondary | ICD-10-CM | POA: Diagnosis not present

## 2022-04-28 DIAGNOSIS — M0579 Rheumatoid arthritis with rheumatoid factor of multiple sites without organ or systems involvement: Secondary | ICD-10-CM | POA: Diagnosis not present

## 2022-04-28 DIAGNOSIS — M25559 Pain in unspecified hip: Secondary | ICD-10-CM | POA: Diagnosis not present

## 2022-04-28 DIAGNOSIS — M25532 Pain in left wrist: Secondary | ICD-10-CM | POA: Diagnosis not present

## 2022-04-28 DIAGNOSIS — R5383 Other fatigue: Secondary | ICD-10-CM | POA: Diagnosis not present

## 2022-04-28 DIAGNOSIS — M25551 Pain in right hip: Secondary | ICD-10-CM | POA: Diagnosis not present

## 2022-04-28 DIAGNOSIS — M545 Low back pain, unspecified: Secondary | ICD-10-CM | POA: Diagnosis not present

## 2022-05-15 ENCOUNTER — Other Ambulatory Visit: Payer: Self-pay

## 2022-05-15 ENCOUNTER — Encounter (HOSPITAL_COMMUNITY): Payer: Self-pay | Admitting: Emergency Medicine

## 2022-05-15 ENCOUNTER — Emergency Department (HOSPITAL_COMMUNITY): Payer: BC Managed Care – PPO

## 2022-05-15 ENCOUNTER — Emergency Department (HOSPITAL_COMMUNITY)
Admission: EM | Admit: 2022-05-15 | Discharge: 2022-05-15 | Disposition: A | Discharge status: Home / Self Care | Payer: BC Managed Care – PPO | Attending: Emergency Medicine | Admitting: Emergency Medicine

## 2022-05-15 DIAGNOSIS — Z041 Encounter for examination and observation following transport accident: Secondary | ICD-10-CM | POA: Diagnosis not present

## 2022-05-15 DIAGNOSIS — I6381 Other cerebral infarction due to occlusion or stenosis of small artery: Secondary | ICD-10-CM | POA: Diagnosis not present

## 2022-05-15 DIAGNOSIS — I10 Essential (primary) hypertension: Secondary | ICD-10-CM | POA: Insufficient documentation

## 2022-05-15 DIAGNOSIS — R479 Unspecified speech disturbances: Secondary | ICD-10-CM | POA: Insufficient documentation

## 2022-05-15 DIAGNOSIS — Y92410 Unspecified street and highway as the place of occurrence of the external cause: Secondary | ICD-10-CM | POA: Diagnosis not present

## 2022-05-15 DIAGNOSIS — F430 Acute stress reaction: Secondary | ICD-10-CM | POA: Diagnosis not present

## 2022-05-15 DIAGNOSIS — R4781 Slurred speech: Secondary | ICD-10-CM | POA: Diagnosis not present

## 2022-05-15 LAB — URINALYSIS, ROUTINE W REFLEX MICROSCOPIC
Bacteria, UA: NONE SEEN
Bilirubin Urine: NEGATIVE
Glucose, UA: NEGATIVE mg/dL
Hgb urine dipstick: NEGATIVE
Ketones, ur: NEGATIVE mg/dL
Nitrite: NEGATIVE
Protein, ur: NEGATIVE mg/dL
Specific Gravity, Urine: 1.005 (ref 1.005–1.030)
pH: 7 (ref 5.0–8.0)

## 2022-05-15 LAB — CBC
HCT: 41.7 % (ref 36.0–46.0)
Hemoglobin: 13.5 g/dL (ref 12.0–15.0)
MCH: 27.8 pg (ref 26.0–34.0)
MCHC: 32.4 g/dL (ref 30.0–36.0)
MCV: 85.8 fL (ref 80.0–100.0)
Platelets: 281 10*3/uL (ref 150–400)
RBC: 4.86 MIL/uL (ref 3.87–5.11)
RDW: 13.4 % (ref 11.5–15.5)
WBC: 6.2 10*3/uL (ref 4.0–10.5)
nRBC: 0 % (ref 0.0–0.2)

## 2022-05-15 LAB — I-STAT CHEM 8, ED
BUN: 6 mg/dL (ref 6–20)
Calcium, Ion: 1.19 mmol/L (ref 1.15–1.40)
Chloride: 109 mmol/L (ref 98–111)
Creatinine, Ser: 0.8 mg/dL (ref 0.44–1.00)
Glucose, Bld: 97 mg/dL (ref 70–99)
HCT: 43 % (ref 36.0–46.0)
Hemoglobin: 14.6 g/dL (ref 12.0–15.0)
Potassium: 3.8 mmol/L (ref 3.5–5.1)
Sodium: 144 mmol/L (ref 135–145)
TCO2: 25 mmol/L (ref 22–32)

## 2022-05-15 LAB — DIFFERENTIAL
Abs Immature Granulocytes: 0.01 10*3/uL (ref 0.00–0.07)
Basophils Absolute: 0.1 10*3/uL (ref 0.0–0.1)
Basophils Relative: 1 %
Eosinophils Absolute: 0.1 10*3/uL (ref 0.0–0.5)
Eosinophils Relative: 2 %
Immature Granulocytes: 0 %
Lymphocytes Relative: 28 %
Lymphs Abs: 1.7 10*3/uL (ref 0.7–4.0)
Monocytes Absolute: 0.5 10*3/uL (ref 0.1–1.0)
Monocytes Relative: 8 %
Neutro Abs: 3.8 10*3/uL (ref 1.7–7.7)
Neutrophils Relative %: 61 %

## 2022-05-15 LAB — COMPREHENSIVE METABOLIC PANEL
ALT: 26 U/L (ref 0–44)
AST: 21 U/L (ref 15–41)
Albumin: 3.9 g/dL (ref 3.5–5.0)
Alkaline Phosphatase: 115 U/L (ref 38–126)
Anion gap: 5 (ref 5–15)
BUN: 7 mg/dL (ref 6–20)
CO2: 25 mmol/L (ref 22–32)
Calcium: 8.9 mg/dL (ref 8.9–10.3)
Chloride: 111 mmol/L (ref 98–111)
Creatinine, Ser: 0.76 mg/dL (ref 0.44–1.00)
GFR, Estimated: >60 mL/min (ref >60)
Glucose, Bld: 98 mg/dL (ref 70–99)
Potassium: 3.7 mmol/L (ref 3.5–5.1)
Sodium: 141 mmol/L (ref 135–145)
Total Bilirubin: 0.5 mg/dL (ref 0.3–1.2)
Total Protein: 8.2 g/dL — ABNORMAL HIGH (ref 6.5–8.1)

## 2022-05-15 LAB — RAPID URINE DRUG SCREEN, HOSP PERFORMED
Amphetamines: NOT DETECTED
Barbiturates: NOT DETECTED
Benzodiazepines: NOT DETECTED
Cocaine: NOT DETECTED
Opiates: NOT DETECTED
Tetrahydrocannabinol: NOT DETECTED

## 2022-05-15 LAB — POC URINE PREG, ED: Preg Test, Ur: NEGATIVE

## 2022-05-15 LAB — ETHANOL: Alcohol, Ethyl (B): <10 mg/dL (ref <10)

## 2022-05-15 LAB — CBG MONITORING, ED: Glucose-Capillary: 115 mg/dL — ABNORMAL HIGH (ref 70–99)

## 2022-05-15 LAB — PROTIME-INR
INR: 0.9 (ref 0.8–1.2)
Prothrombin Time: 12.5 seconds (ref 11.4–15.2)

## 2022-05-15 LAB — APTT: aPTT: 31 seconds (ref 24–36)

## 2022-05-15 NOTE — Consult Note (Signed)
1050 Cart activation. Pt not on screen with staff reporting pt in CT 1052 Neuro paged 1055 Dr Amada Jupiter joins cart 1104 Pt returns from CT. Pt with hx of previous stroke and MG and suffers from some chronic speech deficits and r side sensation deficits.  MRS 1.

## 2022-05-15 NOTE — ED Notes (Signed)
Patient transported to CT 
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
## 2022-05-18 ENCOUNTER — Ambulatory Visit: Payer: BC Managed Care – PPO | Admitting: Internal Medicine

## 2022-05-18 ENCOUNTER — Encounter: Payer: Self-pay | Admitting: Internal Medicine

## 2022-05-18 ENCOUNTER — Other Ambulatory Visit: Payer: Self-pay | Admitting: Internal Medicine

## 2022-05-18 VITALS — BP 136/82 | HR 84 | Resp 18 | Ht 67.0 in | Wt 167.2 lb

## 2022-05-18 DIAGNOSIS — M0579 Rheumatoid arthritis with rheumatoid factor of multiple sites without organ or systems involvement: Secondary | ICD-10-CM | POA: Insufficient documentation

## 2022-05-18 DIAGNOSIS — Z8673 Personal history of transient ischemic attack (TIA), and cerebral infarction without residual deficits: Secondary | ICD-10-CM

## 2022-05-18 DIAGNOSIS — F331 Major depressive disorder, recurrent, moderate: Secondary | ICD-10-CM | POA: Diagnosis not present

## 2022-05-18 DIAGNOSIS — Z09 Encounter for follow-up examination after completed treatment for conditions other than malignant neoplasm: Secondary | ICD-10-CM | POA: Diagnosis not present

## 2022-05-18 DIAGNOSIS — G7000 Myasthenia gravis without (acute) exacerbation: Secondary | ICD-10-CM

## 2022-05-18 DIAGNOSIS — E559 Vitamin D deficiency, unspecified: Secondary | ICD-10-CM

## 2022-05-18 MED ORDER — SERTRALINE HCL 25 MG PO TABS: 25.0000 mg | ORAL_TABLET | Freq: Every day | ORAL | 3 refills | Status: DC

## 2022-05-18 NOTE — Patient Instructions (Signed)
Please take Zoloft as prescribed.  Please continue taking other medications as prescribed.

## 2022-05-21 NOTE — Assessment & Plan Note (Signed)
On Mestinon now Followed by Neurology 

## 2022-05-21 NOTE — Assessment & Plan Note (Signed)
Flowsheet Row Office Visit from 05/18/2022 in [facility]  PHQ-9 Total Score 15     Has been depressed since moving to [place] Likely adjustment disorder Has history of MDD Started Zoloft 25 mg daily
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
## 2022-05-21 NOTE — Assessment & Plan Note (Signed)
In 07/2020, has residual speech difficulty Previous MRI brain reviewed On Aspirin and statin Followed by Neurology 

## 2022-05-27 DIAGNOSIS — M25532 Pain in left wrist: Secondary | ICD-10-CM | POA: Diagnosis not present

## 2022-05-27 DIAGNOSIS — M25531 Pain in right wrist: Secondary | ICD-10-CM | POA: Diagnosis not present

## 2022-05-27 DIAGNOSIS — M0579 Rheumatoid arthritis with rheumatoid factor of multiple sites without organ or systems involvement: Secondary | ICD-10-CM | POA: Diagnosis not present

## 2022-05-27 DIAGNOSIS — M545 Low back pain, unspecified: Secondary | ICD-10-CM | POA: Diagnosis not present

## 2022-06-14 DIAGNOSIS — I10 Essential (primary) hypertension: Secondary | ICD-10-CM | POA: Diagnosis not present

## 2022-06-14 DIAGNOSIS — I69322 Dysarthria following cerebral infarction: Secondary | ICD-10-CM | POA: Diagnosis not present

## 2022-06-14 DIAGNOSIS — G7000 Myasthenia gravis without (acute) exacerbation: Secondary | ICD-10-CM | POA: Diagnosis not present

## 2022-08-01 ENCOUNTER — Other Ambulatory Visit: Payer: Self-pay | Admitting: Internal Medicine

## 2022-08-12 ENCOUNTER — Other Ambulatory Visit: Payer: Self-pay | Admitting: Internal Medicine

## 2022-08-12 DIAGNOSIS — I10 Essential (primary) hypertension: Secondary | ICD-10-CM

## 2022-08-24 DIAGNOSIS — I10 Essential (primary) hypertension: Secondary | ICD-10-CM | POA: Diagnosis not present

## 2022-08-24 DIAGNOSIS — G7000 Myasthenia gravis without (acute) exacerbation: Secondary | ICD-10-CM | POA: Diagnosis not present

## 2022-08-24 DIAGNOSIS — I69322 Dysarthria following cerebral infarction: Secondary | ICD-10-CM | POA: Diagnosis not present

## 2022-08-30 ENCOUNTER — Ambulatory Visit (INDEPENDENT_AMBULATORY_CARE_PROVIDER_SITE_OTHER): Payer: BC Managed Care – PPO | Admitting: Internal Medicine

## 2022-08-30 ENCOUNTER — Encounter: Payer: Self-pay | Admitting: Internal Medicine

## 2022-08-30 VITALS — BP 135/81 | HR 80 | Ht 67.0 in | Wt 172.6 lb

## 2022-08-30 DIAGNOSIS — M069 Rheumatoid arthritis, unspecified: Secondary | ICD-10-CM | POA: Diagnosis not present

## 2022-08-30 DIAGNOSIS — G7000 Myasthenia gravis without (acute) exacerbation: Secondary | ICD-10-CM

## 2022-08-30 DIAGNOSIS — F331 Major depressive disorder, recurrent, moderate: Secondary | ICD-10-CM | POA: Diagnosis not present

## 2022-08-30 DIAGNOSIS — M7989 Other specified soft tissue disorders: Secondary | ICD-10-CM

## 2022-08-30 DIAGNOSIS — I10 Essential (primary) hypertension: Secondary | ICD-10-CM | POA: Diagnosis not present

## 2022-08-30 DIAGNOSIS — Z23 Encounter for immunization: Secondary | ICD-10-CM

## 2022-08-30 DIAGNOSIS — Z8673 Personal history of transient ischemic attack (TIA), and cerebral infarction without residual deficits: Secondary | ICD-10-CM

## 2022-08-30 DIAGNOSIS — Z0001 Encounter for general adult medical examination with abnormal findings: Secondary | ICD-10-CM

## 2022-08-30 DIAGNOSIS — M1610 Unilateral primary osteoarthritis, unspecified hip: Secondary | ICD-10-CM

## 2022-08-30 MED ORDER — SERTRALINE HCL 25 MG PO TABS
25.0000 mg | ORAL_TABLET | Freq: Every day | ORAL | 1 refills | Status: DC
Start: 2022-08-30 — End: 2023-03-14

## 2022-08-30 NOTE — Assessment & Plan Note (Addendum)
BP Readings from Last 1 Encounters:  08/30/22 135/81   Well controlled with Amlodipine 10 mg QD Counseled for compliance with the medications Advised DASH diet and moderate exercise/walking, at least 150 mins/week

## 2022-08-30 NOTE — Assessment & Plan Note (Signed)
Has severe OA of hip Referred to orthopedic surgeon

## 2022-08-30 NOTE — Assessment & Plan Note (Signed)
Followed by Dr. Dr. Nickola Major Used to take Prednisone for RA and Myasthenia Gravis

## 2022-08-30 NOTE — Progress Notes (Signed)
Established Patient Office Visit  Subjective:  Patient ID: Barbara Steele, female    DOB: [birth year]  Age: 61 y.o. MRN: [id number]  CC:  Chief Complaint  Patient presents with   Annual Exam    CPE Bilateral foot swelling x 1 month R worse than L, headaches, requesting local referral to ortho     HPI Barbara Steele is a 61 y.o. female with past medical history of HTN, RA, myasthenia gravis and CVA (07/2020) with residual speech difficulty who presents for annual physical.  HTN: BP is well-controlled. Takes medications regularly. Patient denies headache, dizziness, chest pain, dyspnea or palpitations.   RA: Followed by Dr. Trudie Reed.  She has chronic hip, knee and low back pain.  She was advised to see orthopedic surgeon for hip arthritis.  She comes complains of left > right leg swelling, which is chronic and intermittent.  Her leg swelling is worse with prolonged standing.  Denies any recent injury.  Myasthenia gravis: She has started taking Mestinon.  Followed by neurology.  MDD: She was placed on Zoloft for MDD, she did not continue with it as she did not know she had refills.  She reports that she had noticed improvement in her anhedonia and anxiety when she was taking Zoloft.  She received second dose of Shingrix vaccine today.     Past Medical History:  Diagnosis Date   Acute ischemic stroke ([place]) 07/24/2020   Arthritis    CVA (cerebral vascular accident) ([place])    High cholesterol    Hypertension    Intracranial aneurysm 01/05/2021   Mental disorder    Myasthenia gravis ([place])    Rheumatoid arthritis ([place])    Stroke [facility])     Past Surgical History:  Procedure Laterality Date   BREAST LUMPECTOMY     CATARACT EXTRACTION Bilateral 01/2022   IR 3D INDEPENDENT WKST  07/17/2021   IR ANGIO INTRA EXTRACRAN SEL COM CAROTID INNOMINATE UNI R MOD SED  07/17/2021   IR ANGIO INTRA EXTRACRAN SEL INTERNAL CAROTID UNI L MOD SED  07/17/2021   IR ANGIO VERTEBRAL SEL VERTEBRAL UNI  R MOD SED  07/17/2021   IR US GUIDE VASC ACCESS RIGHT  07/17/2021   MASTECTOMY     thymectomy      Family History  Problem Relation Age of Onset   Stroke Mother    Dementia Mother    Cancer Sister        cervical   Heart disease Brother    Dementia Maternal Grandmother    Stroke Maternal Grandfather    Heart attack Paternal Grandfather     Social History   Socioeconomic History   Marital status: Divorced    Spouse name: Not on file   Number of children: Not on file   Years of education: Not on file   Highest education level: Not on file  Occupational History   Not on file  Tobacco Use   Smoking status: Never   Smokeless tobacco: Never  Vaping Use   Vaping Use: Never used  Substance and Sexual Activity   Alcohol use: Not Currently   Drug use: Never   Sexual activity: Not Currently    Birth control/protection: Post-menopausal  Other Topics Concern   Not on file  Social History Narrative   Not on file   Social Determinants of Health   Financial Resource Strain: Low Risk  (09/18/2020)   Overall Financial Resource Strain (CARDIA)    Difficulty of Paying Living Expenses: Not hard at all  Food Insecurity: No Food Insecurity (09/18/2020)   Hunger Vital Sign    Worried About Running Out of Food in the Last Year: Never true    Ran Out of Food in the Last Year: Never true  Transportation Needs: No Transportation Needs (09/18/2020)   PRAPARE - Hydrologist (Medical): No    Lack of Transportation (Non-Medical): No  Physical Activity: Inactive (09/18/2020)   Exercise Vital Sign    Days of Exercise per Week: 0 days    Minutes of Exercise per Session: 0 min  Stress: Stress Concern Present (09/18/2020)   [place]    Feeling of Stress : Very much  Social Connections: Moderately Isolated (09/18/2020)   Social Connection and Isolation Panel [NHANES]    Frequency of  Communication with Friends and Family: More than three times a week    Frequency of Social Gatherings with Friends and Family: Never    Attends Religious Services: 1 to 4 times per year    Active Member of [employer] or Organizations: No    Attends Archivist Meetings: Never    Marital Status: Divorced  Human resources officer Violence: Not At Risk (09/18/2020)   Humiliation, Afraid, Rape, and Kick questionnaire    Fear of Current or Ex-Partner: No    Emotionally Abused: No    Physically Abused: No    Sexually Abused: No    Outpatient Medications Prior to Visit  Medication Sig Dispense Refill   acetaminophen (TYLENOL) 500 MG tablet Take 500 mg by mouth 2 (two) times daily as needed for moderate pain.     amLODipine (NORVASC) 10 MG tablet TAKE 1 TABLET(10 MG) BY MOUTH DAILY 90 tablet 1   aspirin EC 81 MG tablet Take 81 mg by mouth daily. Swallow whole.     atorvastatin (LIPITOR) 40 MG tablet TAKE 1 TABLET(40 MG) BY MOUTH DAILY 30 tablet 11   pyridostigmine (MESTINON) 60 MG tablet Take 1 tablet (60 mg total) by mouth 3 (three) times daily. 270 tablet 4   Vitamin D, Ergocalciferol, (DRISDOL) 1.25 MG (50000 UNIT) CAPS capsule TAKE 1 CAPSULE BY MOUTH EVERY 7 DAYS 5 capsule 5   sertraline (ZOLOFT) 25 MG tablet Take 1 tablet (25 mg total) by mouth daily. 30 tablet 3   No facility-administered medications prior to visit.    No Known Allergies  ROS Review of Systems  Constitutional:  Negative for chills and fever.  HENT:  Negative for congestion, sinus pressure, sinus pain and sore throat.   Eyes:  Positive for visual disturbance. Negative for pain and discharge.  Respiratory:  Negative for cough and shortness of breath.   Cardiovascular:  Positive for leg swelling. Negative for chest pain and palpitations.  Gastrointestinal:  Negative for abdominal pain, diarrhea, nausea and vomiting.  Endocrine: Negative for polydipsia and polyuria.  Genitourinary:  Negative for dysuria and hematuria.   Musculoskeletal:  Positive for arthralgias and back pain. Negative for neck pain and neck stiffness.  Skin:  Negative for rash.  Neurological:  Positive for speech difficulty. Negative for dizziness, weakness and numbness.  Psychiatric/Behavioral:  Positive for decreased concentration, dysphoric mood and sleep disturbance. Negative for agitation and behavioral problems. The patient is nervous/anxious.       Objective:    Physical Exam Vitals reviewed.  Constitutional:      General: She is not in acute distress.    Appearance: She is not diaphoretic.  HENT:  Head: Normocephalic and atraumatic.     Nose: Nose normal.     Mouth/Throat:     Mouth: Mucous membranes are moist.  Eyes:     General: No scleral icterus.    Extraocular Movements: Extraocular movements intact.  Cardiovascular:     Rate and Rhythm: Normal rate and regular rhythm.     Pulses: Normal pulses.     Heart sounds: Normal heart sounds. No murmur heard. Pulmonary:     Breath sounds: Normal breath sounds. No wheezing or rales.  Abdominal:     Palpations: Abdomen is soft.     Tenderness: There is no abdominal tenderness.  Musculoskeletal:     Cervical back: Neck supple. No tenderness.     Right lower leg: Edema (1+) present.     Left lower leg: Edema (1+) present.  Skin:    General: Skin is warm.     Findings: No rash.  Neurological:     General: No focal deficit present.     Mental Status: She is alert and oriented to person, place, and time.     Cranial Nerves: Dysarthria present. No cranial nerve deficit.     Sensory: No sensory deficit.     Motor: No weakness.  Psychiatric:        Mood and Affect: Mood normal.        Behavior: Behavior normal.     BP 135/81 (BP Location: Right Arm, Patient Position: Sitting, Cuff Size: Large)   Pulse 80   Ht _0  (1.702 m)   Wt 172 lb 9.6 oz (78.3 kg)   SpO2 97%   BMI 27.03 kg/m  Wt Readings from Last 3 Encounters:  08/30/22 172 lb 9.6 oz (78.3 kg)   05/18/22 167 lb 3.2 oz (75.8 kg)  05/15/22 169 lb 12.1 oz (77 kg)    Lab Results  Component Value Date   TSH 0.501 08/05/2021   Lab Results  Component Value Date   WBC 6.2 05/15/2022   HGB 14.6 05/15/2022   HCT 43.0 05/15/2022   MCV 85.8 05/15/2022   PLT 281 05/15/2022   Lab Results  Component Value Date   NA 144 05/15/2022   K 3.8 05/15/2022   CO2 25 05/15/2022   GLUCOSE 97 05/15/2022   BUN 6 05/15/2022   CREATININE 0.80 05/15/2022   BILITOT 0.5 05/15/2022   ALKPHOS 115 05/15/2022   AST 21 05/15/2022   ALT 26 05/15/2022   PROT 8.2 (H) 05/15/2022   ALBUMIN 3.9 05/15/2022   CALCIUM 8.9 05/15/2022   ANIONGAP 5 05/15/2022   EGFR 86 02/26/2022   Lab Results  Component Value Date   CHOL 161 08/05/2021   Lab Results  Component Value Date   HDL 66 08/05/2021   Lab Results  Component Value Date   LDLCALC 79 08/05/2021   Lab Results  Component Value Date   TRIG 85 08/05/2021   Lab Results  Component Value Date   CHOLHDL 2.4 08/05/2021   Lab Results  Component Value Date   HGBA1C 5.2 08/05/2021      Assessment & Plan:   Problem List Items Addressed This Visit       Cardiovascular and Mediastinum   Essential hypertension (Chronic)    BP Readings from Last 1 Encounters:  08/30/22 135/81  Well controlled with Amlodipine 10 mg QD Counseled for compliance with the medications Advised DASH diet and moderate exercise/walking, at least 150 mins/week        Nervous and Auditory  Myasthenia gravis without acute exacerbation (HCC) (Chronic)    On Mestinon now Followed by Neurology      Relevant Medications   sertraline (ZOLOFT) 25 MG tablet     Musculoskeletal and Integument   Rheumatoid arthritis ([place])    Followed by Dr. Dr. Trudie Reed Used to take Prednisone for RA and Myasthenia Gravis      Hip arthritis    Has severe OA of hip Referred to orthopedic surgeon      Relevant Orders   Ambulatory referral to Orthopedic Surgery     Other    History of stroke    In 07/2020, has residual speech difficulty Previous MRI brain reviewed On Aspirin and statin Followed by Neurology      MDD (major depressive disorder)    Helper Office Visit from 05/18/2022 in [facility]  PHQ-9 Total Score 15     Has been depressed since moving to [place] Likely adjustment disorder Has history of MDD Started Zoloft 25 mg daily, needs to be compliant      Relevant Medications   sertraline (ZOLOFT) 25 MG tablet   Encounter for general adult medical examination with abnormal findings - Primary    Physical exam as documented. Counseling done  re healthy lifestyle involving commitment to 150 minutes exercise per week, heart healthy diet, and attaining healthy weight.The importance of adequate sleep also discussed. Changes in health habits are decided on by the patient with goals and time frames  set for achieving them. Immunization and cancer screening needs are specifically addressed at this visit.  She had blood test done at Dr. Trudie Reed' office, will try to obtain records Has had cologuard, will try to obtain record.      Leg swelling    Likely due to chronic venous insufficiency and/or due to Amlodipine Advised to perform leg elevation Compression socks as needed      Other Visit Diagnoses     Immunization due       Relevant Orders   Varicella-zoster vaccine IM (Completed)   Need for immunization against influenza       Relevant Orders   Flu Vaccine QUAD 9moIM (Fluarix, Fluzone & Alfiuria Quad PF) (Completed)       Meds ordered this encounter  Medications   sertraline (ZOLOFT) 25 MG tablet    Sig: Take 1 tablet (25 mg total) by mouth daily.    Dispense:  90 tablet    Refill:  1    Follow-up: Return in about 6 months (around 02/28/2023) for HTN and MDD.    RLindell Spar MD

## 2022-08-30 NOTE — Patient Instructions (Signed)
Please continue taking medications as prescribed.  Please continue to follow low salt diet and ambulate as tolerated.  Please keep legs elevated as tolerated for leg swelling. Okay to use compression socks for leg swelling.

## 2022-08-30 NOTE — Assessment & Plan Note (Signed)
On Mestinon now Followed by Neurology

## 2022-08-30 NOTE — Assessment & Plan Note (Addendum)
Physical exam as documented. Counseling done  re healthy lifestyle involving commitment to 150 minutes exercise per week, heart healthy diet, and attaining healthy weight.The importance of adequate sleep also discussed. Changes in health habits are decided on by the patient with goals and time frames  set for achieving them. Immunization and cancer screening needs are specifically addressed at this visit.  She had blood test done at Dr. Nickola Major' office, will try to obtain records Has had cologuard, will try to obtain record.

## 2022-08-30 NOTE — Assessment & Plan Note (Addendum)
Flowsheet Row Office Visit from 05/18/2022 in [facility]  PHQ-9 Total Score 15      Has been depressed since moving to [place] Likely adjustment disorder Has history of MDD Started Zoloft 25 mg daily, needs to be compliant

## 2022-08-30 NOTE — Assessment & Plan Note (Signed)
In 07/2020, has residual speech difficulty Previous MRI brain reviewed On Aspirin and statin Followed by Neurology

## 2022-08-30 NOTE — Assessment & Plan Note (Signed)
Likely due to chronic venous insufficiency and/or due to Amlodipine Advised to perform leg elevation Compression socks as needed

## 2022-09-01 ENCOUNTER — Encounter: Payer: Self-pay | Admitting: Orthopedic Surgery

## 2022-09-21 ENCOUNTER — Ambulatory Visit: Payer: BC Managed Care – PPO | Admitting: Orthopaedic Surgery

## 2022-09-21 ENCOUNTER — Encounter: Payer: Self-pay | Admitting: Orthopaedic Surgery

## 2022-09-21 VITALS — BP 169/91 | HR 82 | Ht 67.0 in | Wt 174.0 lb

## 2022-09-21 DIAGNOSIS — M05752 Rheumatoid arthritis with rheumatoid factor of left hip without organ or systems involvement: Secondary | ICD-10-CM

## 2022-09-21 DIAGNOSIS — M25652 Stiffness of left hip, not elsewhere classified: Secondary | ICD-10-CM

## 2022-09-21 DIAGNOSIS — G7000 Myasthenia gravis without (acute) exacerbation: Secondary | ICD-10-CM

## 2022-09-21 DIAGNOSIS — M05751 Rheumatoid arthritis with rheumatoid factor of right hip without organ or systems involvement: Secondary | ICD-10-CM | POA: Diagnosis not present

## 2022-09-21 NOTE — Progress Notes (Signed)
Subjective:    Patient ID: Barbara Steele, female    DOB: 08-Nov-1960, 61 y.o.   MRN: [id number]  HPI She has multiple medical problems including old CVA October 2021, myasthenia gravis, hypertension and rheumatoid arthritis.  She is followed by [facility]. She is followed by Dr. Lendon Colonel for her rheumatoid disease.  She has had increasing pain of the right hip and difficulty moving about and putting on her socks and shoes.  She has no trauma.  X-rays done in August of this year show changes of both hips, more on the right from the rheumatoid arthritis.  She would like to have something done if possible for her hip pain.  I have reviewed multiple notes from various providers and X-rays.  I have independently reviewed and interpreted x-rays of this patient done at another site by another physician or qualified health professional.    Review of Systems  Constitutional:  Positive for activity change.  Musculoskeletal:  Positive for arthralgias, gait problem and myalgias.  Neurological:        Prior CVA and speech problems (resolved).  Myasthenia gravis.  All other systems reviewed and are negative. For Review of Systems, all other systems reviewed and are negative.  The following is a summary of the past history medically, past history surgically, known current medicines, social history and family history.  This information is gathered electronically by the computer from prior information and documentation.  I review this each visit and have found including this information at this point in the chart is beneficial and informative.   Past Medical History:  Diagnosis Date   Acute ischemic stroke (HCC) 07/24/2020   Arthritis    CVA (cerebral vascular accident) (HCC)    High cholesterol    Hypertension    Intracranial aneurysm 01/05/2021   Mental disorder    Myasthenia gravis (HCC)    Rheumatoid arthritis (HCC)    Stroke [facility])     Past Surgical History:  Procedure  Laterality Date   BREAST LUMPECTOMY     CATARACT EXTRACTION Bilateral 01/2022   IR 3D INDEPENDENT WKST  07/17/2021   IR ANGIO INTRA EXTRACRAN SEL COM CAROTID INNOMINATE UNI R MOD SED  07/17/2021   IR ANGIO INTRA EXTRACRAN SEL INTERNAL CAROTID UNI L MOD SED  07/17/2021   IR ANGIO VERTEBRAL SEL VERTEBRAL UNI R MOD SED  07/17/2021   IR US GUIDE VASC ACCESS RIGHT  07/17/2021   MASTECTOMY     thymectomy      Current Outpatient Medications on File Prior to Visit  Medication Sig Dispense Refill   acetaminophen (TYLENOL) 500 MG tablet Take 500 mg by mouth 2 (two) times daily as needed for moderate pain.     amLODipine (NORVASC) 10 MG tablet TAKE 1 TABLET(10 MG) BY MOUTH DAILY 90 tablet 1   aspirin EC 81 MG tablet Take 81 mg by mouth daily. Swallow whole.     atorvastatin (LIPITOR) 40 MG tablet TAKE 1 TABLET(40 MG) BY MOUTH DAILY 30 tablet 11   pyridostigmine (MESTINON) 60 MG tablet Take 1 tablet (60 mg total) by mouth 3 (three) times daily. 270 tablet 4   sertraline (ZOLOFT) 25 MG tablet Take 1 tablet (25 mg total) by mouth daily. 90 tablet 1   Vitamin D, Ergocalciferol, (DRISDOL) 1.25 MG (50000 UNIT) CAPS capsule TAKE 1 CAPSULE BY MOUTH EVERY 7 DAYS 5 capsule 5   No current facility-administered medications on file prior to visit.    Social History   Socioeconomic  History   Marital status: Divorced    Spouse name: Not on file   Number of children: Not on file   Years of education: Not on file   Highest education level: Not on file  Occupational History   Not on file  Tobacco Use   Smoking status: Never   Smokeless tobacco: Never  Vaping Use   Vaping Use: Never used  Substance and Sexual Activity   Alcohol use: Not Currently   Drug use: Never   Sexual activity: Not Currently    Birth control/protection: Post-menopausal  Other Topics Concern   Not on file  Social History Narrative   Not on file   Social Determinants of Health   Financial Resource Strain: Low Risk   (09/18/2020)   Overall Financial Resource Strain (CARDIA)    Difficulty of Paying Living Expenses: Not hard at all  Food Insecurity: No Food Insecurity (09/18/2020)   Hunger Vital Sign    Worried About Running Out of Food in the Last Year: Never true    Ran Out of Food in the Last Year: Never true  Transportation Needs: No Transportation Needs (09/18/2020)   PRAPARE - Administrator, Civil Service (Medical): No    Lack of Transportation (Non-Medical): No  Physical Activity: Inactive (09/18/2020)   Exercise Vital Sign    Days of Exercise per Week: 0 days    Minutes of Exercise per Session: 0 min  Stress: Stress Concern Present (09/18/2020)   [employer] of Occupational Health - Occupational Stress Questionnaire    Feeling of Stress : Very much  Social Connections: Moderately Isolated (09/18/2020)   Social Connection and Isolation Panel [NHANES]    Frequency of Communication with Friends and Family: More than three times a week    Frequency of Social Gatherings with Friends and Family: Never    Attends Religious Services: 1 to 4 times per year    Active Member of [employer] or Organizations: No    Attends Banker Meetings: Never    Marital Status: Divorced  Catering manager Violence: Not At Risk (09/18/2020)   Humiliation, Afraid, Rape, and Kick questionnaire    Fear of Current or Ex-Partner: No    Emotionally Abused: No    Physically Abused: No    Sexually Abused: No    Family History  Problem Relation Age of Onset   Stroke Mother    Dementia Mother    Cancer Sister        cervical   Heart disease Brother    Dementia Maternal Grandmother    Stroke Maternal Grandfather    Heart attack Paternal Grandfather     BP (!) 169/91   Pulse 82   Ht 5\' 7"  (1.702 m)   Wt 174 lb (78.9 kg)   BMI 27.25 kg/m   Body mass index is 27.25 kg/m.      Objective:   Physical Exam Vitals and nursing note reviewed. Exam conducted with a chaperone present.   Constitutional:      Appearance: She is well-developed.  HENT:     Head: Normocephalic and atraumatic.  Eyes:     Conjunctiva/sclera: Conjunctivae normal.     Pupils: Pupils are equal, round, and reactive to light.  Cardiovascular:     Rate and Rhythm: Normal rate and regular rhythm.  Pulmonary:     Effort: Pulmonary effort is normal.  Abdominal:     Palpations: Abdomen is soft.  Musculoskeletal:     Cervical back: Normal range  of motion and neck supple.       Legs:  Skin:    General: Skin is warm and dry.  Neurological:     Mental Status: She is alert and oriented to person, place, and time.     Cranial Nerves: No cranial nerve deficit.     Motor: No abnormal muscle tone.     Coordination: Coordination normal.     Deep Tendon Reflexes: Reflexes are normal and symmetric. Reflexes normal.  Psychiatric:        Behavior: Behavior normal.        Thought Content: Thought content normal.        Judgment: Judgment normal.           Assessment & Plan:   Encounter Diagnoses  Name Primary?   Decreased range of motion of both hips Yes   Rheumatoid arthritis involving both hips with positive rheumatoid factor (HCC)    Myasthenia gravis (HCC)    I will have Dr. Magnus Ivan see her about possibility of a total hip replacement.  She would need medical clearance of course.  She will talk with her family about this during the holiday.  Call if any problem.  Precautions discussed.  Electronically Signed Darreld Mclean, MD 12/19/20232:29 PM

## 2022-09-21 NOTE — Patient Instructions (Addendum)
 YOU HAVE RA IN YOUR HIPS. YOU HAVE LIMITED MOTION IN YOUR HIPS. YOU MAY BE A CANDIDATE FOR AN ARTIFICIAL HIP SURGERY  You have been referred to see [facility] [address] [place] PHONE: [phone] Elonda Husky is their referral coordinator and she will call you to schedule your initial visit. If you haven't heard from that office in about 1 week, please call them and schedule your appointment.   TAKE A FAMILY MEMBER WITH YOU TO YOUR EVALUATION APPT BECAUSE SOMETIMES THEY MAY HEAR SOMETHING THAT YOU MISSED. TAKE A LIST OF QUESTIONS YOU MAY HAVE.   My name is  and I assist Dr.Keeling. If you need anything before your next appointment, please do not hesitate to call the office at [phone] and ask to leave a message for me. I will respond within 24-48 business hours. IF YOU HAVE MYCHART, SEND ME A MESSAGE. SOMETIMES THIS IS A FASTER WAY TO COMMUNICATE WITH [place].

## 2022-10-18 ENCOUNTER — Ambulatory Visit (INDEPENDENT_AMBULATORY_CARE_PROVIDER_SITE_OTHER): Payer: No Typology Code available for payment source

## 2022-10-18 ENCOUNTER — Ambulatory Visit (INDEPENDENT_AMBULATORY_CARE_PROVIDER_SITE_OTHER): Payer: No Typology Code available for payment source | Admitting: Orthopaedic Surgery

## 2022-10-18 DIAGNOSIS — M1611 Unilateral primary osteoarthritis, right hip: Secondary | ICD-10-CM

## 2022-10-18 DIAGNOSIS — M25551 Pain in right hip: Secondary | ICD-10-CM | POA: Diagnosis not present

## 2022-10-18 DIAGNOSIS — M25552 Pain in left hip: Secondary | ICD-10-CM

## 2022-10-18 NOTE — Progress Notes (Signed)
The patient is a 62 year old for vomiting for the first time referred from Dr. Luna Glasgow in [place] who is an orthopedic surgeon and has been treating her for arthritis involving her hips.  Her right hip hurts much worse her left hip.  X-rays are on the canopy system for me to review.  She does have a history of rheumatoid arthritis with positive rheumatoid factor.  She also has a history of myasthenia gravis in the past.  She is not a diabetic.  She is not on blood thinning medications.  Family is with her today.  Her hip pain is now detrimentally affecting her mobility, her quality of life and activities day living to the point of considering hip replacement surgery for at least her right hip.  She currently denies any headache, chest pain, short of breath, fever, chills, nausea, vomiting.  I was able to review all of her notes within epic including past medical history, social history, and medications.  On exam her right hip is significantly stiff with internal and external rotation with significant pain with rotation.  The left hip actually moves smoothly and fluidly with no blocks to rotation.  X-rays on the canopy system show severe end-stage arthritis of the right hip and mild arthritis of the left hip.  The right hip has bone-on-bone wear involving the entire joint as well as osteophytes around the joint.  Left hip is minimally narrowed.  I had a long and thorough discussion about hip replacement surgery.  I showed her her x-rays and hip replacement model.  We then gave her handout about hip replacement surgery.  I discussed the risks and benefits of the surgery and what to expect from an intraoperative and postoperative course.  She is interested in having her scheduled for a right total hip arthroplasty in the near future.  All questions concerns were answered and addressed.  We will work on getting this scheduled.

## 2022-11-25 ENCOUNTER — Other Ambulatory Visit: Payer: Self-pay

## 2022-11-29 ENCOUNTER — Other Ambulatory Visit: Payer: Self-pay | Admitting: Physician Assistant

## 2022-11-29 DIAGNOSIS — Z01818 Encounter for other preprocedural examination: Secondary | ICD-10-CM

## 2022-12-02 ENCOUNTER — Encounter: Payer: Self-pay | Admitting: Radiology

## 2022-12-09 ENCOUNTER — Encounter: Payer: Self-pay | Admitting: Radiology

## 2022-12-14 NOTE — Progress Notes (Signed)
Surgical Instructions    Your procedure is scheduled on December 21, 2022.  Report to [facility] Main Entrance "A" at 7:30 A.M., then check in with the Admitting office.  Call this number if you have problems the morning of surgery:  [phone]   If you have any questions prior to your surgery date call [phone]: Open Monday-Friday 8am-4pm If you experience any cold or flu symptoms such as cough, fever, chills, shortness of breath, etc. between now and your scheduled surgery, please notify us at the above number     Remember:  Do not eat after midnight the night before your surgery  You may drink clear liquids until 6:30 the morning of your surgery.   Clear liquids allowed are: Water, Non-Citrus Juices (without pulp), Carbonated Beverages, Clear Tea, Black Coffee ONLY (NO MILK, CREAM OR POWDERED CREAMER of any kind), and Gatorade.    Enhanced Recovery after Surgery for Orthopedics Enhanced Recovery after Surgery is a protocol used to improve the stress on your body and your recovery after surgery.  Patient Instructions  The day of surgery (if you do NOT have diabetes):  Drink ONE (1) Pre-Surgery Clear Ensure by 6:30 am the morning of surgery   This drink was given to you during your hospital  pre-op appointment visit. Nothing else to drink after completing the  Pre-Surgery Clear Ensure.         If you have questions, please contact your surgeon's office.     Take these medicines the morning of surgery with A SIP OF WATER:  amLODipine (NORVASC)  atorvastatin (LIPITOR)  pyridostigmine (MESTINON)  sertraline (ZOLOFT)   If needed:  acetaminophen (TYLENOL)   Follow your surgeon's instructions on when to stop Aspirin.  If no instructions were given by your surgeon then you will need to call the office to get those instructions.    As of today, STOP taking any (unless otherwise instructed by your surgeon) Aleve, Naproxen, Ibuprofen, Motrin, Advil, Goody's, BC's, all herbal  medications, fish oil, and all vitamins.  Special instructions:    Oral Hygiene is also important to reduce your risk of infection.  Remember - BRUSH YOUR TEETH THE MORNING OF SURGERY WITH YOUR REGULAR TOOTHPASTE   [facility]- Preparing For Surgery  Before surgery, you can play an important role. Because skin is not sterile, your skin needs to be as free of germs as possible. You can reduce the number of germs on your skin by washing with CHG (chlorahexidine gluconate) Soap before surgery.  CHG is an antiseptic cleaner which kills germs and bonds with the skin to continue killing germs even after washing.     Please do not use if you have an allergy to CHG or antibacterial soaps. If your skin becomes reddened/irritated stop using the CHG.  Do not shave (including legs and underarms) for at least 48 hours prior to first CHG shower. It is OK to shave your face.  Please follow these instructions carefully.     Shower the NIGHT BEFORE SURGERY and the MORNING OF SURGERY with CHG Soap.   If you chose to wash your hair, wash your hair first as usual with your normal shampoo. After you shampoo, rinse your hair and body thoroughly to remove the shampoo.  Then [employer] and genitals (private parts) with your normal soap and rinse thoroughly to remove soap.  After that Use CHG Soap as you would any other liquid soap. You can apply CHG directly to the skin and wash gently  with a scrungie or a clean washcloth.   Apply the CHG Soap to your body ONLY FROM THE NECK DOWN.  Do not use on open wounds or open sores. Avoid contact with your eyes, ears, mouth and genitals (private parts). Wash Face and genitals (private parts)  with your normal soap.   Wash thoroughly, paying special attention to the area where your surgery will be performed.  Thoroughly rinse your body with warm water from the neck down.  DO NOT shower/wash with your normal soap after using and rinsing off the CHG Soap.  Pat yourself dry  with a CLEAN TOWEL.  Wear CLEAN PAJAMAS to bed the night before surgery  Place CLEAN SHEETS on your bed the night before your surgery  DO NOT SLEEP WITH PETS.   Day of Surgery:  Take a shower with CHG soap. Wear Clean/Comfortable clothing the morning of sur Do not apply any deodorants/lotions.   Remember to brush your teeth WITH YOUR REGULAR TOOTHPASTE.  Do not wear jewelry or makeup. Do not wear lotions, powders, perfumes/cologne or deodorant. Do not shave 48 hours prior to surgery.  Men may shave face and neck. Do not bring valuables to the hospital. Do not wear nail polish, gel polish, artificial nails, or any other type of covering on natural nails (fingers and toes) If you have artificial nails or gel coating that need to be removed by a nail salon, please have this removed prior to surgery. Artificial nails or gel coating may interfere with anesthesia's ability to adequately monitor your vital signs.  [facility] is not responsible for any belongings or valuables.    Do NOT Smoke (Tobacco/Vaping)  24 hours prior to your procedure  If you use a CPAP at night, you may bring your mask for your overnight stay.   Contacts, glasses, hearing aids, dentures or partials may not be worn into surgery, please bring cases for these belongings   For patients admitted to the hospital, discharge time will be determined by your treatment team.   Patients discharged the day of surgery will not be allowed to drive home, and someone needs to stay with them for 24 hours.   SURGICAL WAITING ROOM VISITATION Patients having surgery or a procedure may have no more than 2 support people in the waiting area - these visitors may rotate.   Children under the age of 45 must have an adult with them who is not the patient. If the patient needs to stay at the hospital during part of their recovery, the visitor guidelines for inpatient rooms apply. Pre-op nurse will coordinate an appropriate time for 1  support person to accompany patient in pre-op.  This support person may not rotate.   Please refer to [email or web address] for the visitor guidelines for Inpatients (after your surgery is over and you are in a regular room).     If you received a COVID test during your pre-op visit, it is requested that you wear a mask when out in public, stay away from anyone that may not be feeling well, and notify your surgeon if you develop symptoms. If you have been in contact with anyone that has tested positive in the last 10 days, please notify your surgeon.    Please read over the following fact sheets that you were given.

## 2022-12-15 ENCOUNTER — Encounter (HOSPITAL_COMMUNITY): Payer: Self-pay

## 2022-12-15 ENCOUNTER — Other Ambulatory Visit: Payer: Self-pay

## 2022-12-15 ENCOUNTER — Encounter (HOSPITAL_COMMUNITY)
Admission: RE | Admit: 2022-12-15 | Discharge: 2022-12-15 | Disposition: A | Discharge status: Home / Self Care | Payer: No Typology Code available for payment source | Source: Ambulatory Visit | Attending: Orthopaedic Surgery | Admitting: Orthopaedic Surgery

## 2022-12-15 DIAGNOSIS — Z01812 Encounter for preprocedural laboratory examination: Secondary | ICD-10-CM | POA: Diagnosis present

## 2022-12-15 DIAGNOSIS — I10 Essential (primary) hypertension: Secondary | ICD-10-CM | POA: Diagnosis not present

## 2022-12-15 DIAGNOSIS — M1611 Unilateral primary osteoarthritis, right hip: Secondary | ICD-10-CM | POA: Insufficient documentation

## 2022-12-15 DIAGNOSIS — Z8673 Personal history of transient ischemic attack (TIA), and cerebral infarction without residual deficits: Secondary | ICD-10-CM | POA: Insufficient documentation

## 2022-12-15 DIAGNOSIS — M069 Rheumatoid arthritis, unspecified: Secondary | ICD-10-CM | POA: Diagnosis not present

## 2022-12-15 DIAGNOSIS — G7000 Myasthenia gravis without (acute) exacerbation: Secondary | ICD-10-CM | POA: Insufficient documentation

## 2022-12-15 DIAGNOSIS — Z01818 Encounter for other preprocedural examination: Secondary | ICD-10-CM

## 2022-12-15 HISTORY — DX: Anemia, unspecified: D64.9

## 2022-12-15 LAB — COMPREHENSIVE METABOLIC PANEL
ALT: 22 U/L (ref 0–44)
AST: 21 U/L (ref 15–41)
Albumin: 3.9 g/dL (ref 3.5–5.0)
Alkaline Phosphatase: 125 U/L (ref 38–126)
Anion gap: 8 (ref 5–15)
BUN: 9 mg/dL (ref 6–20)
CO2: 25 mmol/L (ref 22–32)
Calcium: 8.9 mg/dL (ref 8.9–10.3)
Chloride: 106 mmol/L (ref 98–111)
Creatinine, Ser: 0.89 mg/dL (ref 0.44–1.00)
GFR, Estimated: >60 mL/min (ref >60)
Glucose, Bld: 99 mg/dL (ref 70–99)
Potassium: 3.5 mmol/L (ref 3.5–5.1)
Sodium: 139 mmol/L (ref 135–145)
Total Bilirubin: 0.2 mg/dL — ABNORMAL LOW (ref 0.3–1.2)
Total Protein: 8.1 g/dL (ref 6.5–8.1)

## 2022-12-15 LAB — CBC
HCT: 43.9 % (ref 36.0–46.0)
Hemoglobin: 14.1 g/dL (ref 12.0–15.0)
MCH: 27.2 pg (ref 26.0–34.0)
MCHC: 32.1 g/dL (ref 30.0–36.0)
MCV: 84.6 fL (ref 80.0–100.0)
Platelets: 292 10*3/uL (ref 150–400)
RBC: 5.19 MIL/uL — ABNORMAL HIGH (ref 3.87–5.11)
RDW: 13.2 % (ref 11.5–15.5)
WBC: 8.1 10*3/uL (ref 4.0–10.5)
nRBC: 0 % (ref 0.0–0.2)

## 2022-12-15 LAB — TYPE AND SCREEN
ABO/RH(D): B POS
Antibody Screen: NEGATIVE

## 2022-12-15 LAB — SURGICAL PCR SCREEN

## 2022-12-15 NOTE — Progress Notes (Addendum)
PCP - Ihor Dow, MD Cardiologist - denies Neurologist - Vikram R. Penumalli, MD  PPM/ICD - denies Device Orders -  Rep Notified -   Chest x-ray - na EKG - 05/17/22 Stress Test - denies ECHO - denies Cardiac Cath - denies  Sleep Study - denies CPAP -   Fasting Blood Sugar - na Checks Blood Sugar _____ times a day  Last dose of GLP1 agonist-  na GLP1 instructions:   Blood Thinner Instructions: na Aspirin Instructions: Pt states she will call her prescribing neurologist and Dr. Trevor Mace office today for instructions regarding when to stop aspirin.   ERAS Protcol -clear liquids until 0630 PRE-SURGERY Ensure or G2- Ensure  COVID TEST- na   Anesthesia review: yes- Pt has history of Myasthenia Gravis .   Patient denies shortness of breath, fever, cough and chest pain at PAT appointment   All instructions explained to the patient, with a verbal understanding of the material. Patient agrees to go over the instructions while at home for a better understanding. Patient also instructed to wear a mask prior to surgery. The opportunity to ask questions was provided.

## 2022-12-16 NOTE — Anesthesia Preprocedure Evaluation (Addendum)
Anesthesia Evaluation  Patient identified by MRN, date of birth, ID band Patient awake    Reviewed: Allergy & Precautions, H&P , NPO status , Patient's Chart, lab work & pertinent test results  Airway Mallampati: II  TM Distance: >3 FB Neck ROM: Full    Dental no notable dental hx. (+) Edentulous Upper, Edentulous Lower, Dental Advisory Given   Pulmonary neg pulmonary ROS   Pulmonary exam normal breath sounds clear to auscultation       Cardiovascular hypertension, Pt. on medications  Rhythm:Regular Rate:Normal     Neuro/Psych    Depression    CVA, Residual Symptoms    GI/Hepatic negative GI ROS, Neg liver ROS,,,  Endo/Other  negative endocrine ROS    Renal/GU negative Renal ROS  negative genitourinary   Musculoskeletal  (+) Arthritis , Osteoarthritis,    Abdominal   Peds  Hematology  (+) Blood dyscrasia, anemia   Anesthesia Other Findings   Reproductive/Obstetrics negative OB ROS                             Anesthesia Physical Anesthesia Plan  ASA: 3  Anesthesia Plan: Spinal   Post-op Pain Management: Tylenol PO (pre-op)*   Induction: Intravenous  PONV Risk Score and Plan: 3 and Propofol infusion, Midazolam and Ondansetron  Airway Management Planned: Natural Airway and Simple Face Mask  Additional Equipment:   Intra-op Plan:   Post-operative Plan:   Informed Consent: I have reviewed the patients History and Physical, chart, labs and discussed the procedure including the risks, benefits and alternatives for the proposed anesthesia with the patient or authorized representative who has indicated his/her understanding and acceptance.     Dental advisory given  Plan Discussed with: CRNA  Anesthesia Plan Comments: (See APP note by Durel Salts, FNP )       Anesthesia Quick Evaluation

## 2022-12-16 NOTE — Progress Notes (Signed)
Anesthesia Chart Review:   Case: [id number] Date/Time: 12/21/22 0921   Procedure: RIGHT TOTAL HIP ARTHROPLASTY ANTERIOR APPROACH (Right: Hip)   Anesthesia type: Spinal   Pre-op diagnosis: OSTEOARTHRITIS / DEGENERATIVE JOINT DISEASE RIGHT HIP   Location: [place] OR ROOM 07 / [place] OR   Surgeons: Mcarthur Rossetti, MD       DISCUSSION: Pt is 63 years old with hx myasthenia gravis (takes pyridostigmine), stroke (2021), HTN, RA, anemia  By neurology notes, myasthenia gravis is generalized; diagnosed in 1990's, s/p thymectomy.   VS: BP (!) 148/82   Pulse 90   Temp 37 C (Oral)   Resp 18   Ht '5\' 7"'$  (1.702 m)   Wt 81.3 kg   SpO2 95%   BMI 28.08 kg/m   PROVIDERS: - PCP is Lindell Spar, MD - Neurologist is Andrey Spearman, MD. Last office visit 02/16/22. Annual f/u recommended   LABS: Labs reviewed: Acceptable for surgery. (all labs ordered are listed, but only abnormal results are displayed)  Labs Reviewed  SURGICAL PCR SCREEN - Abnormal; Notable for the following components:      Result Value   MRSA, PCR   (*)    Value: INVALID, UNABLE TO DETERMINE THE PRESENCE OF TARGET DUE TO SPECIMEN INTEGRITY. RECOLLECTION REQUESTED.   Staphylococcus aureus   (*)    Value: INVALID, UNABLE TO DETERMINE THE PRESENCE OF TARGET DUE TO SPECIMEN INTEGRITY. RECOLLECTION REQUESTED.   All other components within normal limits  CBC - Abnormal; Notable for the following components:   RBC 5.19 (*)    All other components within normal limits  COMPREHENSIVE METABOLIC PANEL - Abnormal; Notable for the following components:   Total Bilirubin 0.2 (*)    All other components within normal limits  TYPE AND SCREEN     IMAGES: CT head 05/15/22:  -  Chronic Left MCA territory small-vessel ischemia is stable since 2021. No acute cortically based infarct or acute intracranial hemorrhage identified. ASPECTS 10.  EKG 05/15/22: Sinus rhythm Low voltage, precordial leads Consider anterior  infarct  CV: Echo 07/25/20:  1. Left ventricular ejection fraction, by estimation, is 65 to 70%. The left ventricle has normal function. The left ventricle has no regional wall motion abnormalities. There is mild left ventricular hypertrophy. Left ventricular diastolic parameters are consistent with Grade I diastolic dysfunction (impaired relaxation).  2. Right ventricular systolic function is normal. The right ventricular size is normal.  3. The mitral valve is normal in structure. No evidence of mitral valve regurgitation. No evidence of mitral stenosis.  4. The aortic valve was not well visualized. Aortic valve regurgitation is not visualized. No aortic stenosis is present.  5. The inferior vena cava is normal in size with greater than 50% respiratory variability, suggesting right atrial pressure of 3 mmHg.   Carotid US 08/14/21:  - RIGHT CAROTID ARTERY: Borderline elevated end-diastolic velocity is likely artifact given only minimal noncalcified plaque of the right internal carotid artery origin. - RIGHT VERTEBRAL ARTERY:  Antegrade flow. - LEFT CAROTID ARTERY: Borderline elevated end-diastolic velocity likely artifact given only minimal calcified plaque at the carotid bifurcation. - LEFT VERTEBRAL ARTERY:  Antegrade flow.   Past Medical History:  Diagnosis Date   Acute ischemic stroke ([place]) 07/24/2020   Anemia    Arthritis    CVA (cerebral vascular accident) ([place])    High cholesterol    Hypertension    Intracranial aneurysm 01/05/2021   Mental disorder    Myasthenia gravis ([place])    Rheumatoid arthritis (  [place])    Stroke [facility])     Past Surgical History:  Procedure Laterality Date   BREAST LUMPECTOMY     CATARACT EXTRACTION Bilateral 01/2022   IR 3D INDEPENDENT WKST  07/17/2021   IR ANGIO INTRA EXTRACRAN SEL COM CAROTID INNOMINATE UNI R MOD SED  07/17/2021   IR ANGIO INTRA EXTRACRAN SEL INTERNAL CAROTID UNI L MOD SED  07/17/2021   IR ANGIO VERTEBRAL SEL VERTEBRAL UNI R MOD SED   07/17/2021   IR US GUIDE VASC ACCESS RIGHT  07/17/2021   MASTECTOMY Bilateral    thymectomy      MEDICATIONS:  acetaminophen (TYLENOL) 500 MG tablet   amLODipine (NORVASC) 10 MG tablet   aspirin EC 81 MG tablet   atorvastatin (LIPITOR) 40 MG tablet   pyridostigmine (MESTINON) 60 MG tablet   sertraline (ZOLOFT) 25 MG tablet   Vitamin D, Ergocalciferol, (DRISDOL) 1.25 MG (50000 UNIT) CAPS capsule   No current facility-administered medications for this encounter.    If no changes, I anticipate pt can proceed with surgery as scheduled.   Willeen Cass, PhD, FNP-BC [facility]/Anesthesiology Phone: [phone] 12/16/2022 2:00 PM

## 2022-12-17 ENCOUNTER — Telehealth: Payer: Self-pay | Admitting: Orthopaedic Surgery

## 2022-12-17 NOTE — Telephone Encounter (Signed)
Patient asking if she needs to stop her ASA prior to surgery.12/21/18

## 2022-12-17 NOTE — Telephone Encounter (Signed)
This is a blackman patient but I would have her stop aspirin asap

## 2022-12-20 NOTE — H&P (Signed)
TOTAL HIP ADMISSION H&P  Patient is admitted for right total hip arthroplasty.  Subjective:  Chief Complaint: right hip pain  HPI: Barbara Steele, 62 y.o. female, has a history of pain and functional disability in the right hip(s) due to arthritis and patient has failed non-surgical conservative treatments for greater than 12 weeks to include NSAID's and/or analgesics, use of assistive devices, and activity modification.  Onset of symptoms was gradual starting 3 years ago with gradually worsening course since that time.The patient noted no past surgery on the right hip(s).  Patient currently rates pain in the right hip at 10 out of 10 with activity. Patient has night pain, worsening of pain with activity and weight bearing, trendelenberg gait, pain that interfers with activities of daily living, and pain with passive range of motion. Patient has evidence of subchondral sclerosis, periarticular osteophytes, and joint space narrowing by imaging studies. This condition presents safety issues increasing the risk of falls.  There is no current active infection.  Patient Active Problem List   Diagnosis Date Noted   Unilateral primary osteoarthritis, right hip 10/18/2022   Hip arthritis 08/30/2022   Leg swelling 08/30/2022   Seropositive rheumatoid arthritis of multiple joints ([place]) 05/18/2022   Encounter for examination following treatment at hospital 05/18/2022   Vitamin D deficiency 02/26/2022   Encounter for general adult medical examination with abnormal findings 08/06/2021   Central retinal artery occlusion of right eye 08/06/2021   Essential hypertension 06/04/2021   Rheumatoid arthritis ([place]) 01/05/2021   History of stroke 09/18/2020   MDD (major depressive disorder) 09/18/2020   Prolonged QT interval 07/24/2020   Myasthenia gravis ([place]) 07/24/2020   Past Medical History:  Diagnosis Date   Acute ischemic stroke ([place]) 07/24/2020   Anemia    Arthritis    CVA (cerebral vascular  accident) ([place])    High cholesterol    Hypertension    Intracranial aneurysm 01/05/2021   Mental disorder    Myasthenia gravis ([place])    Rheumatoid arthritis ([place])    Stroke [facility])     Past Surgical History:  Procedure Laterality Date   BREAST LUMPECTOMY     CATARACT EXTRACTION Bilateral 01/2022   IR 3D INDEPENDENT WKST  07/17/2021   IR ANGIO INTRA EXTRACRAN SEL COM CAROTID INNOMINATE UNI R MOD SED  07/17/2021   IR ANGIO INTRA EXTRACRAN SEL INTERNAL CAROTID UNI L MOD SED  07/17/2021   IR ANGIO VERTEBRAL SEL VERTEBRAL UNI R MOD SED  07/17/2021   IR US GUIDE VASC ACCESS RIGHT  07/17/2021   MASTECTOMY Bilateral    thymectomy      No current facility-administered medications for this encounter.   Current Outpatient Medications  Medication Sig Dispense Refill Last Dose   acetaminophen (TYLENOL) 500 MG tablet Take 500 mg by mouth every 6 (six) hours as needed for moderate pain.      amLODipine (NORVASC) 10 MG tablet TAKE 1 TABLET(10 MG) BY MOUTH DAILY 90 tablet 1    aspirin EC 81 MG tablet Take 81 mg by mouth daily. Swallow whole.      atorvastatin (LIPITOR) 40 MG tablet TAKE 1 TABLET(40 MG) BY MOUTH DAILY 30 tablet 11    pyridostigmine (MESTINON) 60 MG tablet Take 1 tablet (60 mg total) by mouth 3 (three) times daily. 270 tablet 4    sertraline (ZOLOFT) 25 MG tablet Take 1 tablet (25 mg total) by mouth daily. 90 tablet 1    Vitamin D, Ergocalciferol, (DRISDOL) 1.25 MG (50000 UNIT) CAPS capsule TAKE  1 CAPSULE BY MOUTH EVERY 7 DAYS (Patient not taking: Reported on 12/14/2022) 5 capsule 5 Not Taking   No Known Allergies  Social History   Tobacco Use   Smoking status: Never   Smokeless tobacco: Never  Substance Use Topics   Alcohol use: Not Currently    Family History  Problem Relation Age of Onset   Stroke Mother    Dementia Mother    Cancer Sister        cervical   Heart disease Brother    Dementia Maternal Grandmother    Stroke Maternal Grandfather    Heart attack Paternal  Grandfather      Review of Systems  Objective:  Physical Exam Vitals reviewed.  Constitutional:      Appearance: Normal appearance.  HENT:     Head: Normocephalic and atraumatic.  Eyes:     Extraocular Movements: Extraocular movements intact.     Pupils: Pupils are equal, round, and reactive to light.  Cardiovascular:     Rate and Rhythm: Normal rate and regular rhythm.     Pulses: Normal pulses.  Pulmonary:     Effort: Pulmonary effort is normal.     Breath sounds: Normal breath sounds.  Abdominal:     Palpations: Abdomen is soft.  Musculoskeletal:     Cervical back: Normal range of motion and neck supple.     Right hip: Tenderness and bony tenderness present. Decreased range of motion. Decreased strength.  Neurological:     Mental Status: She is alert and oriented to person, place, and time.  Psychiatric:        Behavior: Behavior normal.     Vital signs in last 24 hours:    Labs:   Estimated body mass index is 28.08 kg/m as calculated from the following:   Height as of 12/15/22: 5\' 7"  (1.702 m).   Weight as of 12/15/22: 81.3 kg.   Imaging Review Plain radiographs demonstrate severe degenerative joint disease of the right hip(s). The bone quality appears to be good for age and reported activity level.      Assessment/Plan:  End stage arthritis, right hip(s)  The patient history, physical examination, clinical judgement of the provider and imaging studies are consistent with end stage degenerative joint disease of the right hip(s) and total hip arthroplasty is deemed medically necessary. The treatment options including medical management, injection therapy, arthroscopy and arthroplasty were discussed at length. The risks and benefits of total hip arthroplasty were presented and reviewed. The risks due to aseptic loosening, infection, stiffness, dislocation/subluxation,  thromboembolic complications and other imponderables were discussed.  The patient  acknowledged the explanation, agreed to proceed with the plan and consent was signed. Patient is being admitted for inpatient treatment for surgery, pain control, PT, OT, prophylactic antibiotics, VTE prophylaxis, progressive ambulation and ADL's and discharge planning.The patient is planning to be discharged home with home health services

## 2022-12-20 NOTE — Telephone Encounter (Signed)
Called and advised.

## 2022-12-21 ENCOUNTER — Ambulatory Visit (HOSPITAL_COMMUNITY): Payer: No Typology Code available for payment source

## 2022-12-21 ENCOUNTER — Observation Stay (HOSPITAL_COMMUNITY): Payer: No Typology Code available for payment source

## 2022-12-21 ENCOUNTER — Encounter (HOSPITAL_COMMUNITY): Payer: Self-pay | Admitting: Orthopaedic Surgery

## 2022-12-21 ENCOUNTER — Other Ambulatory Visit: Payer: Self-pay

## 2022-12-21 ENCOUNTER — Observation Stay (HOSPITAL_COMMUNITY)
Admission: RE | Admit: 2022-12-21 | Discharge: 2022-12-23 | Disposition: A | Discharge status: Home / Self Care | Payer: No Typology Code available for payment source | Source: Ambulatory Visit | Attending: Orthopaedic Surgery | Admitting: Orthopaedic Surgery

## 2022-12-21 ENCOUNTER — Ambulatory Visit (HOSPITAL_BASED_OUTPATIENT_CLINIC_OR_DEPARTMENT_OTHER): Payer: No Typology Code available for payment source | Admitting: Anesthesiology

## 2022-12-21 ENCOUNTER — Encounter (HOSPITAL_COMMUNITY)
Admission: RE | Disposition: A | Discharge status: Home / Self Care | Payer: Self-pay | Source: Ambulatory Visit | Attending: Orthopaedic Surgery

## 2022-12-21 ENCOUNTER — Ambulatory Visit (HOSPITAL_COMMUNITY): Payer: No Typology Code available for payment source | Admitting: Emergency Medicine

## 2022-12-21 DIAGNOSIS — I10 Essential (primary) hypertension: Secondary | ICD-10-CM | POA: Diagnosis not present

## 2022-12-21 DIAGNOSIS — Z8673 Personal history of transient ischemic attack (TIA), and cerebral infarction without residual deficits: Secondary | ICD-10-CM

## 2022-12-21 DIAGNOSIS — Z79899 Other long term (current) drug therapy: Secondary | ICD-10-CM | POA: Diagnosis not present

## 2022-12-21 DIAGNOSIS — M1611 Unilateral primary osteoarthritis, right hip: Secondary | ICD-10-CM

## 2022-12-21 DIAGNOSIS — D649 Anemia, unspecified: Secondary | ICD-10-CM

## 2022-12-21 DIAGNOSIS — Z7982 Long term (current) use of aspirin: Secondary | ICD-10-CM | POA: Insufficient documentation

## 2022-12-21 DIAGNOSIS — Z01818 Encounter for other preprocedural examination: Secondary | ICD-10-CM

## 2022-12-21 DIAGNOSIS — Z96641 Presence of right artificial hip joint: Secondary | ICD-10-CM

## 2022-12-21 HISTORY — PX: TOTAL HIP ARTHROPLASTY: SHX124

## 2022-12-21 LAB — ABO/RH: ABO/RH(D): B POS

## 2022-12-21 LAB — SURGICAL PCR SCREEN
MRSA, PCR: NEGATIVE
Staphylococcus aureus: NEGATIVE

## 2022-12-21 SURGERY — ARTHROPLASTY, HIP, TOTAL, ANTERIOR APPROACH
Anesthesia: Spinal | Site: Hip | Laterality: Right

## 2022-12-21 MED ORDER — METHOCARBAMOL 500 MG PO TABS
ORAL_TABLET | ORAL | Status: AC
  Filled 2022-12-21: qty 1

## 2022-12-21 MED ORDER — CEFAZOLIN SODIUM-DEXTROSE 2-4 GM/100ML-% IV SOLN
2.0000 g | INTRAVENOUS | Status: AC
  Administered 2022-12-21: 2 g via INTRAVENOUS
  Filled 2022-12-21: qty 100

## 2022-12-21 MED ORDER — MIDAZOLAM HCL 2 MG/2ML IJ SOLN
INTRAMUSCULAR | Status: AC
  Filled 2022-12-21: qty 2

## 2022-12-21 MED ORDER — 0.9 % SODIUM CHLORIDE (POUR BTL) OPTIME
TOPICAL | Status: DC | PRN
  Administered 2022-12-21: 1000 mL

## 2022-12-21 MED ORDER — SODIUM CHLORIDE 0.9 % IV SOLN: INTRAVENOUS | Status: DC

## 2022-12-21 MED ORDER — ONDANSETRON HCL 4 MG/2ML IJ SOLN
4.0000 mg | Freq: Four times a day (QID) | INTRAMUSCULAR | Status: DC | PRN
  Administered 2022-12-21: 4 mg via INTRAVENOUS
  Filled 2022-12-21: qty 2

## 2022-12-21 MED ORDER — PROPOFOL 10 MG/ML IV BOLUS
INTRAVENOUS | Status: AC
  Filled 2022-12-21: qty 20

## 2022-12-21 MED ORDER — OXYCODONE HCL 5 MG PO TABS
ORAL_TABLET | ORAL | Status: AC
  Filled 2022-12-21: qty 2

## 2022-12-21 MED ORDER — OXYCODONE HCL 5 MG PO TABS
5.0000 mg | ORAL_TABLET | ORAL | Status: DC | PRN
  Filled 2022-12-21: qty 1
  Filled 2022-12-21: qty 2

## 2022-12-21 MED ORDER — DIPHENHYDRAMINE HCL 12.5 MG/5ML PO ELIX: 12.5000 mg | ORAL_SOLUTION | ORAL | Status: DC | PRN

## 2022-12-21 MED ORDER — ONDANSETRON HCL 4 MG/2ML IJ SOLN
INTRAMUSCULAR | Status: DC | PRN
  Administered 2022-12-21: 4 mg via INTRAVENOUS

## 2022-12-21 MED ORDER — PHENYLEPHRINE HCL-NACL 20-0.9 MG/250ML-% IV SOLN
INTRAVENOUS | Status: DC | PRN
  Administered 2022-12-21: 30 ug/min via INTRAVENOUS

## 2022-12-21 MED ORDER — ACETAMINOPHEN 500 MG PO TABS: 1000.0000 mg | ORAL_TABLET | Freq: Once | ORAL | Status: AC

## 2022-12-21 MED ORDER — HYDROMORPHONE HCL 1 MG/ML IJ SOLN: 0.2500 mg | INTRAMUSCULAR | Status: DC | PRN

## 2022-12-21 MED ORDER — ACETAMINOPHEN 325 MG PO TABS: 325.0000 mg | ORAL_TABLET | Freq: Four times a day (QID) | ORAL | Status: DC | PRN

## 2022-12-21 MED ORDER — FENTANYL CITRATE (PF) 250 MCG/5ML IJ SOLN
INTRAMUSCULAR | Status: DC | PRN
  Administered 2022-12-21: 50 ug via INTRAVENOUS

## 2022-12-21 MED ORDER — AMLODIPINE BESYLATE 10 MG PO TABS
10.0000 mg | ORAL_TABLET | Freq: Every day | ORAL | Status: DC
  Administered 2022-12-22 – 2022-12-23 (×2): 10 mg via ORAL
  Filled 2022-12-21 (×2): qty 1

## 2022-12-21 MED ORDER — PHENOL 1.4 % MT LIQD: 1.0000 | OROMUCOSAL | Status: DC | PRN

## 2022-12-21 MED ORDER — PROPOFOL 500 MG/50ML IV EMUL
INTRAVENOUS | Status: DC | PRN
  Administered 2022-12-21: 40 ug/kg/min via INTRAVENOUS

## 2022-12-21 MED ORDER — METOCLOPRAMIDE HCL 5 MG/ML IJ SOLN
5.0000 mg | Freq: Three times a day (TID) | INTRAMUSCULAR | Status: DC | PRN
  Administered 2022-12-21: 10 mg via INTRAVENOUS
  Filled 2022-12-21: qty 2

## 2022-12-21 MED ORDER — SODIUM CHLORIDE 0.9 % IR SOLN
Status: DC | PRN
  Administered 2022-12-21: 1000 mL

## 2022-12-21 MED ORDER — MENTHOL 3 MG MT LOZG: 1.0000 | LOZENGE | OROMUCOSAL | Status: DC | PRN

## 2022-12-21 MED ORDER — DEXAMETHASONE SODIUM PHOSPHATE 10 MG/ML IJ SOLN
INTRAMUSCULAR | Status: DC | PRN
  Administered 2022-12-21: 10 mg via INTRAVENOUS

## 2022-12-21 MED ORDER — ALBUMIN HUMAN 5 % IV SOLN: INTRAVENOUS | Status: DC | PRN

## 2022-12-21 MED ORDER — PANTOPRAZOLE SODIUM 40 MG PO TBEC
40.0000 mg | DELAYED_RELEASE_TABLET | Freq: Every day | ORAL | Status: DC
  Administered 2022-12-22 – 2022-12-23 (×2): 40 mg via ORAL
  Filled 2022-12-21 (×3): qty 1

## 2022-12-21 MED ORDER — ORAL CARE MOUTH RINSE: 15.0000 mL | Freq: Once | OROMUCOSAL | Status: AC

## 2022-12-21 MED ORDER — DOCUSATE SODIUM 100 MG PO CAPS
100.0000 mg | ORAL_CAPSULE | Freq: Two times a day (BID) | ORAL | Status: DC
  Administered 2022-12-21 – 2022-12-23 (×4): 100 mg via ORAL
  Filled 2022-12-21 (×4): qty 1

## 2022-12-21 MED ORDER — MIDAZOLAM HCL 2 MG/2ML IJ SOLN
INTRAMUSCULAR | Status: DC | PRN
  Administered 2022-12-21: 1 mg via INTRAVENOUS

## 2022-12-21 MED ORDER — HYDROMORPHONE HCL 1 MG/ML IJ SOLN
0.5000 mg | INTRAMUSCULAR | Status: DC | PRN
  Administered 2022-12-21: 0.5 mg via INTRAVENOUS
  Filled 2022-12-21: qty 1

## 2022-12-21 MED ORDER — CHLORHEXIDINE GLUCONATE 0.12 % MT SOLN
OROMUCOSAL | Status: AC
  Administered 2022-12-21: 15 mL via OROMUCOSAL
  Filled 2022-12-21: qty 15

## 2022-12-21 MED ORDER — POVIDONE-IODINE 10 % EX SWAB
2.0000 | Freq: Once | CUTANEOUS | Status: AC
  Administered 2022-12-21: 2 via TOPICAL

## 2022-12-21 MED ORDER — ACETAMINOPHEN 500 MG PO TABS
ORAL_TABLET | ORAL | Status: AC
  Administered 2022-12-21: 1000 mg via ORAL
  Filled 2022-12-21: qty 2

## 2022-12-21 MED ORDER — CHLORHEXIDINE GLUCONATE 0.12 % MT SOLN: 15.0000 mL | Freq: Once | OROMUCOSAL | Status: AC

## 2022-12-21 MED ORDER — TRANEXAMIC ACID-NACL 1000-0.7 MG/100ML-% IV SOLN
1000.0000 mg | INTRAVENOUS | Status: AC
  Administered 2022-12-21: 1000 mg via INTRAVENOUS
  Filled 2022-12-21: qty 100

## 2022-12-21 MED ORDER — CEFAZOLIN SODIUM-DEXTROSE 1-4 GM/50ML-% IV SOLN
1.0000 g | Freq: Four times a day (QID) | INTRAVENOUS | Status: AC
  Administered 2022-12-21 (×2): 1 g via INTRAVENOUS
  Filled 2022-12-21 (×2): qty 50

## 2022-12-21 MED ORDER — OXYCODONE HCL 5 MG PO TABS
10.0000 mg | ORAL_TABLET | ORAL | Status: DC | PRN
  Administered 2022-12-21 – 2022-12-22 (×3): 10 mg via ORAL
  Administered 2022-12-23: 15 mg via ORAL
  Filled 2022-12-21: qty 3
  Filled 2022-12-21 (×2): qty 2

## 2022-12-21 MED ORDER — PHENYLEPHRINE 80 MCG/ML (10ML) SYRINGE FOR IV PUSH (FOR BLOOD PRESSURE SUPPORT): PREFILLED_SYRINGE | INTRAVENOUS | Status: DC | PRN

## 2022-12-21 MED ORDER — ORAL CARE MOUTH RINSE: 15.0000 mL | OROMUCOSAL | Status: DC | PRN

## 2022-12-21 MED ORDER — ASPIRIN 81 MG PO CHEW
81.0000 mg | CHEWABLE_TABLET | Freq: Two times a day (BID) | ORAL | Status: DC
  Administered 2022-12-21 – 2022-12-23 (×4): 81 mg via ORAL
  Filled 2022-12-21 (×4): qty 1

## 2022-12-21 MED ORDER — PROPOFOL 10 MG/ML IV BOLUS
INTRAVENOUS | Status: DC | PRN
  Administered 2022-12-21: 40 mg via INTRAVENOUS
  Administered 2022-12-21: 30 mg via INTRAVENOUS

## 2022-12-21 MED ORDER — METHOCARBAMOL 1000 MG/10ML IJ SOLN: 500.0000 mg | Freq: Four times a day (QID) | INTRAVENOUS | Status: DC | PRN

## 2022-12-21 MED ORDER — METHOCARBAMOL 500 MG PO TABS
500.0000 mg | ORAL_TABLET | Freq: Four times a day (QID) | ORAL | Status: DC | PRN
  Administered 2022-12-21 – 2022-12-22 (×2): 500 mg via ORAL
  Filled 2022-12-21: qty 1

## 2022-12-21 MED ORDER — ALUM & MAG HYDROXIDE-SIMETH 200-200-20 MG/5ML PO SUSP: 30.0000 mL | ORAL | Status: DC | PRN

## 2022-12-21 MED ORDER — METOCLOPRAMIDE HCL 5 MG PO TABS: 5.0000 mg | ORAL_TABLET | Freq: Three times a day (TID) | ORAL | Status: DC | PRN

## 2022-12-21 MED ORDER — BUPIVACAINE IN DEXTROSE 0.75-8.25 % IT SOLN
INTRATHECAL | Status: DC | PRN
  Administered 2022-12-21: 1.6 mL via INTRATHECAL

## 2022-12-21 MED ORDER — FENTANYL CITRATE (PF) 250 MCG/5ML IJ SOLN
INTRAMUSCULAR | Status: AC
  Filled 2022-12-21: qty 5

## 2022-12-21 MED ORDER — SERTRALINE HCL 25 MG PO TABS
25.0000 mg | ORAL_TABLET | Freq: Every day | ORAL | Status: DC
  Administered 2022-12-22 – 2022-12-23 (×2): 25 mg via ORAL
  Filled 2022-12-21 (×2): qty 1

## 2022-12-21 MED ORDER — PYRIDOSTIGMINE BROMIDE 60 MG PO TABS
60.0000 mg | ORAL_TABLET | Freq: Three times a day (TID) | ORAL | Status: DC
  Administered 2022-12-22 – 2022-12-23 (×4): 60 mg via ORAL
  Filled 2022-12-21 (×8): qty 1

## 2022-12-21 MED ORDER — LACTATED RINGERS IV SOLN: INTRAVENOUS | Status: DC

## 2022-12-21 SURGICAL SUPPLY — 56 items
APL SKNCLS STERI-STRIP NONHPOA (GAUZE/BANDAGES/DRESSINGS) ×1
BAG COUNTER SPONGE SURGICOUNT (BAG) ×1 IMPLANT
BAG SPNG CNTER NS LX DISP (BAG) ×1
BENZOIN TINCTURE PRP APPL 2/3 (GAUZE/BANDAGES/DRESSINGS) ×1 IMPLANT
BLADE CLIPPER SURG (BLADE) IMPLANT
BLADE SAW SGTL 18X1.27X75 (BLADE) ×1 IMPLANT
COVER SURGICAL LIGHT HANDLE (MISCELLANEOUS) ×1 IMPLANT
CUP SECTOR GRIPTON 50MM (Cup) IMPLANT
DRAPE C-ARM 42X72 X-RAY (DRAPES) ×1 IMPLANT
DRAPE STERI IOBAN 125X83 (DRAPES) ×1 IMPLANT
DRAPE U-SHAPE 47X51 STRL (DRAPES) ×3 IMPLANT
DRSG AQUACEL AG ADV 3.5X10 (GAUZE/BANDAGES/DRESSINGS) ×1 IMPLANT
DURAPREP 26ML APPLICATOR (WOUND CARE) ×1 IMPLANT
ELECT BLADE 4.0 EZ CLEAN MEGAD (MISCELLANEOUS) ×1
ELECT BLADE 6.5 EXT (BLADE) IMPLANT
ELECT REM PT RETURN 9FT ADLT (ELECTROSURGICAL) ×1
ELECTRODE BLDE 4.0 EZ CLN MEGD (MISCELLANEOUS) ×1 IMPLANT
ELECTRODE REM PT RTRN 9FT ADLT (ELECTROSURGICAL) ×1 IMPLANT
FACESHIELD WRAPAROUND (MASK) ×2 IMPLANT
FACESHIELD WRAPAROUND OR TEAM (MASK) ×2 IMPLANT
GLOVE BIOGEL PI IND STRL 8 (GLOVE) ×2 IMPLANT
GLOVE ECLIPSE 8.0 STRL XLNG CF (GLOVE) ×1 IMPLANT
GLOVE ORTHO TXT STRL SZ7.5 (GLOVE) ×2 IMPLANT
GOWN STRL REUS W/ TWL LRG LVL3 (GOWN DISPOSABLE) ×2 IMPLANT
GOWN STRL REUS W/ TWL XL LVL3 (GOWN DISPOSABLE) ×2 IMPLANT
GOWN STRL REUS W/TWL LRG LVL3 (GOWN DISPOSABLE) ×2
GOWN STRL REUS W/TWL XL LVL3 (GOWN DISPOSABLE) ×2
HANDPIECE INTERPULSE COAX TIP (DISPOSABLE) ×1
HEAD FEMORAL 32 CERAMIC (Hips) IMPLANT
KIT BASIN OR (CUSTOM PROCEDURE TRAY) ×1 IMPLANT
KIT TURNOVER KIT B (KITS) ×1 IMPLANT
LINER ACET PNNCL PLUS4 NEUTRAL (Hips) IMPLANT
MANIFOLD NEPTUNE II (INSTRUMENTS) ×1 IMPLANT
NS IRRIG 1000ML POUR BTL (IV SOLUTION) ×1 IMPLANT
PACK TOTAL JOINT (CUSTOM PROCEDURE TRAY) ×1 IMPLANT
PAD ARMBOARD 7.5X6 YLW CONV (MISCELLANEOUS) ×1 IMPLANT
PINNACLE PLUS 4 NEUTRAL (Hips) ×1 IMPLANT
SET HNDPC FAN SPRY TIP SCT (DISPOSABLE) ×1 IMPLANT
STAPLER VISISTAT 35W (STAPLE) IMPLANT
STEM FEM ACTIS STD SZ4 (Stem) IMPLANT
STRIP CLOSURE SKIN 1/2X4 (GAUZE/BANDAGES/DRESSINGS) ×2 IMPLANT
SUT ETHIBOND NAB CT1 #1 30IN (SUTURE) ×1 IMPLANT
SUT MNCRL AB 4-0 PS2 18 (SUTURE) IMPLANT
SUT VIC AB 0 CT1 27 (SUTURE) ×2
SUT VIC AB 0 CT1 27XBRD ANBCTR (SUTURE) ×1 IMPLANT
SUT VIC AB 1 CT1 27 (SUTURE) ×2
SUT VIC AB 1 CT1 27XBRD ANBCTR (SUTURE) ×1 IMPLANT
SUT VIC AB 2-0 CT1 27 (SUTURE) ×2
SUT VIC AB 2-0 CT1 TAPERPNT 27 (SUTURE) ×1 IMPLANT
TOWEL GREEN STERILE (TOWEL DISPOSABLE) ×1 IMPLANT
TOWEL GREEN STERILE FF (TOWEL DISPOSABLE) ×1 IMPLANT
TRAY CATH INTERMITTENT SS 16FR (CATHETERS) IMPLANT
TRAY FOLEY MTR SLVR 14FR STAT (SET/KITS/TRAYS/PACK) IMPLANT
TRAY FOLEY W/BAG SLVR 16FR (SET/KITS/TRAYS/PACK)
TRAY FOLEY W/BAG SLVR 16FR ST (SET/KITS/TRAYS/PACK) IMPLANT
WATER STERILE IRR 1000ML POUR (IV SOLUTION) ×2 IMPLANT

## 2022-12-21 NOTE — Progress Notes (Signed)
    Durable Medical Equipment  (From admission, onward)           Start     Ordered   12/21/22 1558  For home use only DME Bedside commode  Once       Question:  Patient needs a bedside commode to treat with the following condition  Answer:  Weakness   12/21/22 1558   12/21/22 1524  DME 3 n 1  Once        12/21/22 1523   12/21/22 1524  DME Walker rolling  Once       Question Answer Comment  Walker: With 5 Inch Wheels   Patient needs a walker to treat with the following condition Status post total replacement of right hip      12/21/22 1523           Patient will be confined to one room and unable to ambulate to bathroom, requires bedside commode

## 2022-12-21 NOTE — Anesthesia Procedure Notes (Signed)
Date/Time: 12/21/2022 9:46 AM  Performed by: Michele Rockers, CRNAPre-anesthesia Checklist: Patient identified, Emergency Drugs available, Suction available, Timeout performed and Patient being monitored Patient Re-evaluated:Patient Re-evaluated prior to induction Oxygen Delivery Method: Simple face mask

## 2022-12-21 NOTE — Op Note (Signed)
Operative Note  Date of operation: 12/21/2022 Preoperative diagnosis: Right hip primary osteoarthritis Postoperative diagnosis: Same  Procedure: Right direct anterior total hip arthroplasty  Implants: Implant Name Type Inv. Item Serial No. Manufacturer Lot No. LRB No. Used Action  CUP SECTOR GRIPTON 50MM - [id number] Cup CUP SECTOR GRIPTON 50MM  [employer] [id number] Right 1 Implanted  PINNACLE PLUS 4 NEUTRAL - [id number] Hips PINNACLE PLUS 4 NEUTRAL  [employer] [id number] Right 1 Implanted  STEM FEM ACTIS STD SZ4 - [id number] Stem STEM FEM ACTIS STD SZ4  [employer] [id number] Right 1 Implanted  HEAD FEMORAL 32 CERAMIC - [id number] Hips HEAD FEMORAL 32 CERAMIC  [employer] [id number] Right 1 Implanted   Surgeon: Lind Guest. Ninfa Linden, MD Assistant: Benita Stabile, PA-C  Anesthesia: Spinal EBL: 150 cc Antibiotics: IV Ancef Complications: None  Indications: The patient is a 62 year old female with rheumatoid arthritis who also has developed significant osteoarthritis with protrusio of her right hip.  Her pain is daily with that right hip and it is detrimentally affecting her mobility, her quality of life and her actives daily living.  She is at the point she wishes to proceed with a total hip arthroplasty and we agree with this as well based on her clinical exam and x-ray findings combined with the failure conservative treatment.  We did talk about the risks of acute blood loss anemia, nerve or vessel injury, fracture, infection, DVT, dislocation, implant failure, leg length differences and wound healing issues.  We talked about her goals being decreased pain, improve mobility, and improve quality of life.  Procedure description: After informed consent was obtained and the appropriate right hip was marked, the patient was brought to the operating room and set up on the stretcher where spinal anesthesia was obtained.  She was then laid in supine position on the stretcher and  her leg lengths were assessed.  Traction boots were placed on both her feet and a Foley catheter was placed as well.  Next the patient was placed supine on the Hana fracture table with a perineal post in place in both legs and inline skeletal traction devices but no traction applied.  Her right operative hip and pelvis were assessed radiographically.  The right hip was then prepped and draped with DuraPrep and sterile drapes.  A timeout was called and she was identified as the correct patient and the correct right hip.  An incision was then made just inferior and posterior to the ASIS and carried slightly obliquely down the leg.  Dissection was carried down to the tensor fascia lata muscle and the tensor fascia was then divided longitudinally to proceed with a direct anterior broach of the hip.  Circumflex vessels were identified and cauterized and the hip capsule identified opened up in L-type format finding a moderate joint effusion.  Cobra retractors were placed around the medial lateral femoral neck and a femoral neck cut was made with an oscillating saw just proximal to the lesser trochanter and completed with an osteotome.  A corkscrew guide was placed in the femoral head and the femoral head was removed in's entirety finding a wide area devoid of cartilage.  A bent Hohmann was then placed over the medial acetabular rim and remnants of the acetabular labrum and other debris removed.  Reaming was then initiated from a size 43 reamer and stepwise increments going to a size 49 reamer with all reamers placed under direct visualization and the last report also placed under direct fluoroscopy in order to obtain  the depth of reaming, the inclination and anteversion.  The real DePuy sector Fults #component size 50 was then placed without difficulty under direct visualization and fluoroscopy.  We then went with a 32+4 polythene liner based on her protrusio and offset.  Attention was then turned to the femur.  With  the right leg externally rotated to 120 degrees, extended and adducted, a Mueller retractor was placed medially and a Hohmann retractor was placed behind the greater trochanter.  The lateral joint capsule was released and a box cutting osteotome was used in her femoral canal.  Broaching was then initiated from a size 0 broach and stepwise increments going to a size 4 broach.  With a size 4 broach in place we trialed a standard offset femoral neck and a 32+1 trial hip ball.  The leg was then brought over and up and with traction and internal rotation reduced in the pelvis.  Assessing it radiographically and clinically were pleased with leg length, offset, range of motion and stability.  We then dislocated the hip and remove the trial components.  We placed the real Actis femoral component size 4 with standard offset and the real 32+1 ceramic hip ball and again reduce his Nast abdomen we are pleased with this assessment.  The soft tissue was then irrigated with normal saline solution.  We assessed the hip radiographically and were pleased.  The joint capsule was closed interrupted #1 Ethibond suture followed by a #1 Vicryl to close the tensor fascia.  0 Vicryl was used to close the deep tissue and 2-0 Vicryl was used to close subcutaneous tissue.  The skin was closed with staples.  An Aquacel dressing was applied.  The patient was taken off the Hana table and taken to recovery in stable addition.  Benita Stabile, PA-C did assist during the entire case and his assistance was crucial for soft tissue management and retraction, helping guide implant placement and was medically necessary for a layered closure of the wound.

## 2022-12-21 NOTE — TOC Initial Note (Signed)
Transition of Care [facility]) - Initial/Assessment Note    Patient Details  Name: Barbara Steele MRN: [id number] Date of Birth: [birth year]  Transition of [facility] CM/SW Contact:    Carles Collet, RN Phone Number: 12/21/2022, 4:06 PM  Clinical Narrative:                  Damaris Schooner w patient over the phone.  DME She will need BSC and RW, these have been ordered through [employer] and will be delivered to her room for discharge.  [facility] unable to accept, discussed available providers, Alvis Lemmings able to accept for [facility].  Patient states that her daughter is off work this week and is able to assist her in her recovery after DC.  Anticipate family will transport home at DC.     Expected Discharge Plan: [place] Barriers to Discharge: Continued Medical Work up   Patient Goals and CMS Choice Patient states their goals for this hospitalization and ongoing recovery are:: to return home CMS Medicare.gov Compare Post Acute Care list provided to:: Patient Choice offered to / list presented to : Patient      Expected Discharge Plan and Services   Discharge Planning Services: CM Consult Post Acute Care Choice: Durable Medical Equipment, Home Health Living arrangements for the past 2 months: [place]                 DME Arranged: Bedside commode, Walker rolling DME Agency: [employer] Date DME Agency Contacted: 12/21/22 Time DME Agency Contacted: [phone] Representative spoke with at DME Agency: [place]: PT [place]: [place] Date [place]: 12/21/22 Time [place]: [place] Representative spoke with at [place]: [place] Arrangements/Services Living arrangements for the past 2 months: [place] Lives with:: Adult Children   Do you feel safe going back to the place where you live?: Yes               Activities of Daily Living      Permission Sought/Granted                  Emotional  Assessment              Admission diagnosis:  Status post total replacement of right hip [Z96.641] Patient Active Problem List   Diagnosis Date Noted   Status post total replacement of right hip 12/21/2022   Unilateral primary osteoarthritis, right hip 10/18/2022   Hip arthritis 08/30/2022   Leg swelling 08/30/2022   Seropositive rheumatoid arthritis of multiple joints ([place]) 05/18/2022   Encounter for examination following treatment at hospital 05/18/2022   Vitamin D deficiency 02/26/2022   Encounter for general adult medical examination with abnormal findings 08/06/2021   Central retinal artery occlusion of right eye 08/06/2021   Essential hypertension 06/04/2021   Rheumatoid arthritis ([place]) 01/05/2021   History of stroke 09/18/2020   MDD (major depressive disorder) 09/18/2020   Prolonged QT interval 07/24/2020   Myasthenia gravis ([place]) 07/24/2020   PCP:  Lindell Spar, MD Pharmacy:   [facility] [place], [place] AT [place] [id number] [place] [address] Phone: [phone] Fax: [phone]  [facility] [place], [place] [address] AT [place] [place] [address] Phone: [phone] Fax: [phone]     Social Determinants  of Health (SDOH) Social History: SDOH Screenings   Food Insecurity: No Food Insecurity (09/18/2020)  Housing: Low Risk  (09/18/2020)  Transportation Needs: No Transportation Needs (09/18/2020)  Alcohol Screen: Low Risk  (09/18/2020)  Depression (PHQ2-9): Low Risk  (08/30/2022)  Financial Resource Strain: Low Risk  (09/18/2020)  Physical Activity: Inactive (09/18/2020)  Social Connections: Moderately Isolated (09/18/2020)  Stress: Stress Concern Present (09/18/2020)  Tobacco Use: Low Risk  (12/21/2022)   SDOH Interventions:     Readmission Risk Interventions     No data to display

## 2022-12-21 NOTE — Anesthesia Procedure Notes (Signed)
Spinal  Patient location during procedure: OR Start time: 12/21/2022 9:50 AM End time: 12/21/2022 9:55 AM Reason for block: surgical anesthesia Staffing Performed: anesthesiologist  Anesthesiologist: Roderic Palau, MD Performed by: Roderic Palau, MD Authorized by: Roderic Palau, MD   Preanesthetic Checklist Completed: patient identified, IV checked, risks and benefits discussed, surgical consent, monitors and equipment checked, pre-op evaluation and timeout performed Spinal Block Patient position: sitting Prep: DuraPrep Patient monitoring: cardiac monitor, continuous pulse ox and blood pressure Approach: midline Location: L3-4 Injection technique: single-shot Needle Needle type: Pencan  Needle gauge: 24 G Needle length: 9 cm Assessment Sensory level: T8 Events: CSF return Additional Notes Functioning IV was confirmed and monitors were applied. Sterile prep and drape, including hand hygiene and sterile gloves were used. The patient was positioned and the spine was prepped. The skin was anesthetized with lidocaine.  Free flow of clear CSF was obtained prior to injecting local anesthetic into the CSF.  The spinal needle aspirated freely following injection.  The needle was carefully withdrawn.  The patient tolerated the procedure well.

## 2022-12-21 NOTE — Evaluation (Signed)
Physical Therapy Evaluation Patient Details Name: Barbara Steele MRN: [id number] DOB: 1960/10/10 Today's Date: 12/21/2022  History of Present Illness  62 y.o. female presents to [facility] hospital on 12/21/2022 for elective R THA. PMH includes CVA, OA, HLD, HTN, myasthenia gravis, RA.  Clinical Impression  PTA, pt lives with her daughter and is a limited community ambulator with no AD. Pt presents with expected deficits post R THA including RLE weakness, pain, and gait abnormalities. Pt requiring min assist overall for functional mobility. Initiated performance of HEP for strengthening/ROM and able to take pivotal steps from bed to recliner using RW. Further distance deferred due to nausea/dizziness. Suspect pt will progress well. Will plan for further gait and stair training prior to d/c.     Recommendations for follow up therapy are one component of a multi-disciplinary discharge planning process, led by the attending physician.  Recommendations may be updated based on patient status, additional functional criteria and insurance authorization.  Follow Up Recommendations Follow physician's recommendations for discharge plan and follow up therapies      Assistance Recommended at Discharge PRN  Patient can return home with the following  A little help with walking and/or transfers;A little help with bathing/dressing/bathroom;Assistance with cooking/housework;Assist for transportation;Help with stairs or ramp for entrance    Equipment Recommendations Rolling walker (2 wheels);BSC/3in1  Recommendations for Other Services       Functional Status Assessment Patient has had a recent decline in their functional status and demonstrates the ability to make significant improvements in function in a reasonable and predictable amount of time.     Precautions / Restrictions Precautions Precautions: Fall Restrictions Weight Bearing Restrictions: No RLE Weight Bearing: Non weight bearing      Mobility   Bed Mobility Overal bed mobility: Needs Assistance Bed Mobility: Supine to Sit     Supine to sit: Min assist     General bed mobility comments: MinA for RLE negotiation off of bed    Transfers Overall transfer level: Needs assistance Equipment used: Rolling walker (2 wheels) Transfers: Sit to/from Stand Sit to Stand: Min assist           General transfer comment: Light minA to rise to standing position    Ambulation/Gait Ambulation/Gait assistance: Min guard Gait Distance (Feet): 3 Feet Assistive device: Rolling walker (2 wheels) Gait Pattern/deviations: Step-through pattern, Decreased stride length, Decreased weight shift to right, Decreased stance time - right Gait velocity: decreased     General Gait Details: Pivotal steps from bed to chair with min guard assist, cueing for sequencing/direction  Stairs            Wheelchair Mobility    Modified Rankin (Stroke Patients Only)       Balance Overall balance assessment: Needs assistance Sitting-balance support: Feet supported Sitting balance-Leahy Scale: Good     Standing balance support: Bilateral upper extremity supported Standing balance-Leahy Scale: Poor                               Pertinent Vitals/Pain Pain Assessment Pain Assessment: Faces Faces Pain Scale: Hurts little more Pain Location: R hip Pain Descriptors / Indicators: Operative site guarding, Discomfort, Grimacing Pain Intervention(s): Limited activity within patient's tolerance, Monitored during session, Premedicated before session, Ice applied    Home Living Family/patient expects to be discharged to:: Private residence Living Arrangements: Children (daughter) Available Help at Discharge: Family Type of Home: House Home Access: Stairs to enter Entrance Stairs-Rails: None Entrance  Stairs-Number of Steps: 3   Home Layout: One level Home Equipment: None      Prior Function Prior Level of Function :  Independent/Modified Independent             Mobility Comments: Limited community ambulator, uses shopping cart when grocery shopping ADLs Comments: Daughter assists with IADL's such as cooking     Hand Dominance        Extremity/Trunk Assessment   Upper Extremity Assessment Upper Extremity Assessment: Overall WFL for tasks assessed    Lower Extremity Assessment Lower Extremity Assessment: RLE deficits/detail RLE Deficits / Details: s/p THA. Grossly 2+/5    Cervical / Trunk Assessment Cervical / Trunk Assessment: Normal  Communication   Communication: No difficulties  Cognition Arousal/Alertness: Awake/alert Behavior During Therapy: WFL for tasks assessed/performed Overall Cognitive Status: Within Functional Limits for tasks assessed                                          General Comments      Exercises Total Joint Exercises Ankle Circles/Pumps: AROM, Left, 10 reps, Supine Quad Sets: Right, 10 reps, Supine Heel Slides: AROM, Right, 5 reps, Supine Long Arc Quad: AROM, Right, 5 reps, Seated   Assessment/Plan    PT Assessment Patient needs continued PT services  PT Problem List Decreased strength;Decreased activity tolerance;Decreased mobility;Decreased balance;Pain       PT Treatment Interventions DME instruction;Gait training;Stair training;Therapeutic exercise;Functional mobility training;Therapeutic activities;Balance training;Patient/family education    PT Goals (Current goals can be found in the Care Plan section)  Acute Rehab PT Goals Patient Stated Goal: "to walk without pain" PT Goal Formulation: With patient Time For Goal Achievement: 01/04/23 Potential to Achieve Goals: Good    Frequency 7X/week     Co-evaluation               AM-PAC PT "6 Clicks" Mobility  Outcome Measure Help needed turning from your back to your side while in a flat bed without using bedrails?: A Little Help needed moving from lying on your  back to sitting on the side of a flat bed without using bedrails?: A Little Help needed moving to and from a bed to a chair (including a wheelchair)?: A Little Help needed standing up from a chair using your arms (e.g., wheelchair or bedside chair)?: A Little Help needed to walk in hospital room?: A Little Help needed climbing 3-5 steps with a railing? : A Little 6 Click Score: 18    End of Session Equipment Utilized During Treatment: Gait belt Activity Tolerance: Patient tolerated treatment well Patient left: in chair;with call bell/phone within reach;with family/visitor present Nurse Communication: Mobility status PT Visit Diagnosis: Other abnormalities of gait and mobility (R26.89);Pain;Difficulty in walking, not elsewhere classified (R26.2) Pain - Right/Left: Right Pain - part of body: Hip    Time: [id number] PT Time Calculation (min) (ACUTE ONLY): 26 min   Charges:   PT Evaluation $PT Eval Low Complexity: 1 Low PT Treatments $Therapeutic Activity: 8-22 mins        Wyona Almas, PT, DPT Acute Rehabilitation Services Office [phone]   Deno Etienne 12/21/2022, 4:46 PM

## 2022-12-21 NOTE — Interval H&P Note (Signed)
History and Physical Interval Note: The patient understands that she is here today for right hip replacement to treat her severe right hip arthritis.  There has been no acute or interval change in her medical status.  See recent H&P.  The risks and benefits of surgery been discussed in detail and informed consent is obtained.  The right operative hip has been marked.  12/21/2022 9:06 AM  Barbara Steele  has presented today for surgery, with the diagnosis of OSTEOARTHRITIS / Central.  The various methods of treatment have been discussed with the patient and family. After consideration of risks, benefits and other options for treatment, the patient has consented to  Procedure(s): RIGHT TOTAL HIP ARTHROPLASTY ANTERIOR APPROACH (Right) as a surgical intervention.  The patient's history has been reviewed, patient examined, no change in status, stable for surgery.  I have reviewed the patient's chart and labs.  Questions were answered to the patient's satisfaction.     Mcarthur Rossetti

## 2022-12-21 NOTE — Anesthesia Postprocedure Evaluation (Signed)
Anesthesia Post Note  Patient: Terrell Wobig  Procedure(s) Performed: RIGHT TOTAL HIP ARTHROPLASTY ANTERIOR APPROACH (Right: Hip)     Patient location during evaluation: PACU Anesthesia Type: Spinal Level of consciousness: oriented and awake and alert Pain management: pain level controlled Vital Signs Assessment: post-procedure vital signs reviewed and stable Respiratory status: spontaneous breathing and respiratory function stable Cardiovascular status: blood pressure returned to baseline and stable Postop Assessment: no headache, no backache, no apparent nausea or vomiting, spinal receding and patient able to bend at knees Anesthetic complications: no  No notable events documented.  Last Vitals:  Vitals:   12/21/22 1315 12/21/22 1330  BP: 134/72 124/79  Pulse: 75 69  Resp: 19 11  Temp:    SpO2: 94% 94%    Last Pain:  Vitals:   12/21/22 1245  TempSrc:   PainSc: 2                  Latoiya Maradiaga,W. EDMOND

## 2022-12-21 NOTE — Transfer of Care (Addendum)
Immediate Anesthesia Transfer of Care Note  Patient: Barbara Steele  Procedure(s) Performed: RIGHT TOTAL HIP ARTHROPLASTY ANTERIOR APPROACH (Right: Hip)  Patient Location: PACU  Anesthesia Type:Spinal  Level of Consciousness: alert  and oriented  Airway & Oxygen Therapy: Patient Spontanous Breathing and Patient connected to face mask oxygen  Post-op Assessment: Report given to RN and Post -op Vital signs reviewed and stable  Post vital signs: Reviewed and stable  Last Vitals:  Vitals Value Taken Time  BP 113/64   Temp 98.2   Pulse 67   Resp 12   SpO2 97     Last Pain:  Vitals:   12/21/22 0817  TempSrc:   PainSc: 0-No pain         Complications: No notable events documented.

## 2022-12-22 ENCOUNTER — Encounter (HOSPITAL_COMMUNITY): Payer: Self-pay | Admitting: Orthopaedic Surgery

## 2022-12-22 DIAGNOSIS — M1611 Unilateral primary osteoarthritis, right hip: Secondary | ICD-10-CM | POA: Diagnosis not present

## 2022-12-22 LAB — CBC
HCT: 37.1 % (ref 36.0–46.0)
Hemoglobin: 12.1 g/dL (ref 12.0–15.0)
MCH: 27.3 pg (ref 26.0–34.0)
MCHC: 32.6 g/dL (ref 30.0–36.0)
MCV: 83.7 fL (ref 80.0–100.0)
Platelets: 246 10*3/uL (ref 150–400)
RBC: 4.43 MIL/uL (ref 3.87–5.11)
RDW: 13.1 % (ref 11.5–15.5)
WBC: 13.2 10*3/uL — ABNORMAL HIGH (ref 4.0–10.5)
nRBC: 0 % (ref 0.0–0.2)

## 2022-12-22 LAB — BASIC METABOLIC PANEL
Anion gap: 8 (ref 5–15)
BUN: 9 mg/dL (ref 6–20)
CO2: 23 mmol/L (ref 22–32)
Calcium: 8.3 mg/dL — ABNORMAL LOW (ref 8.9–10.3)
Chloride: 108 mmol/L (ref 98–111)
Creatinine, Ser: 0.72 mg/dL (ref 0.44–1.00)
GFR, Estimated: >60 mL/min (ref >60)
Glucose, Bld: 143 mg/dL — ABNORMAL HIGH (ref 70–99)
Potassium: 3.5 mmol/L (ref 3.5–5.1)
Sodium: 139 mmol/L (ref 135–145)

## 2022-12-22 MED ORDER — OXYCODONE HCL 5 MG PO TABS: 5.0000 mg | ORAL_TABLET | Freq: Four times a day (QID) | ORAL | 0 refills | Status: DC | PRN

## 2022-12-22 MED ORDER — ASPIRIN 81 MG PO CHEW: 81.0000 mg | CHEWABLE_TABLET | Freq: Two times a day (BID) | ORAL | 0 refills | Status: AC

## 2022-12-22 MED ORDER — METHOCARBAMOL 500 MG PO TABS: 500.0000 mg | ORAL_TABLET | Freq: Four times a day (QID) | ORAL | 1 refills | Status: AC | PRN

## 2022-12-22 NOTE — Progress Notes (Signed)
Physical Therapy Treatment Patient Details Name: Barbara Steele MRN: [id number] DOB: August 03, 1961 Today's Date: 12/22/2022   History of Present Illness 62 y.o. female presents to [facility] hospital on 12/21/2022 for elective R THA. PMH includes CVA, OA, HLD, HTN, myasthenia gravis, RA.    PT Comments    Pt is progressing towards goals. Currently she is supervision for bed mobility and CGA for gait/transfers with AD. Due to pt current functional status, home set up and available assistance at home recommending skilled physical therapy services on discharge per physician recommendation in order to improve balance, strength and mobility to prevent falls, injury and re-hospitalization. Pt demonstrates no signs/symptoms of cardiac/respiratory distress throughout session. O2 sats remained within normal limits.    Recommendations for follow up therapy are one component of a multi-disciplinary discharge planning process, led by the attending physician.  Recommendations may be updated based on patient status, additional functional criteria and insurance authorization.  Follow Up Recommendations  Follow physician's recommendations for discharge plan and follow up therapies     Assistance Recommended at Discharge PRN  Patient can return home with the following A little help with walking and/or transfers;A little help with bathing/dressing/bathroom;Assistance with cooking/housework;Assist for transportation;Help with stairs or ramp for entrance   Equipment Recommendations  Rolling walker (2 wheels);BSC/3in1    Recommendations for Other Services       Precautions / Restrictions Precautions Precautions: Fall Restrictions Weight Bearing Restrictions: No RLE Weight Bearing: Weight bearing as tolerated     Mobility  Bed Mobility Overal bed mobility: Needs Assistance Bed Mobility: Supine to Sit     Supine to sit: Supervision     General bed mobility comments: Pt requires increased time but did not  need physical assistance to get her LE off the bed. Patient Response: Cooperative  Transfers Overall transfer level: Needs assistance Equipment used: Rolling walker (2 wheels) Transfers: Sit to/from Stand Sit to Stand: Min guard           General transfer comment: No physical assist required. Pt does require verbal cues for safe hand placement.    Ambulation/Gait Ambulation/Gait assistance: Min guard Gait Distance (Feet): 100 Feet Assistive device: Rolling walker (2 wheels) Gait Pattern/deviations: Decreased stride length, Decreased weight shift to right, Decreased stance time - right, Step-to pattern, Antalgic Gait velocity: decreased cadence Gait velocity interpretation: <1.8 ft/sec, indicate of risk for recurrent falls   General Gait Details: Pt states that she ambulated similarily prior to surgery due to prior CVA.        Balance Overall balance assessment: Needs assistance Sitting-balance support: Feet supported Sitting balance-Leahy Scale: Normal     Standing balance support: Single extremity supported, Bilateral upper extremity supported Standing balance-Leahy Scale: Fair Standing balance comment: No overt LOB     Cognition Arousal/Alertness: Awake/alert Behavior During Therapy: WFL for tasks assessed/performed Overall Cognitive Status: Within Functional Limits for tasks assessed         General Comments General comments (skin integrity, edema, etc.): Pt needs extra time to perform activities. Her daughter will be available on discharge      Pertinent Vitals/Pain Pain Assessment Pain Assessment: Faces Faces Pain Scale: Hurts little more Pain Location: R hip Pain Descriptors / Indicators: Operative site guarding, Discomfort, Grimacing Pain Intervention(s): Monitored during session, Patient requesting pain meds-RN notified, Ice applied     PT Goals (current goals can now be found in the care plan section) Acute Rehab PT Goals Patient Stated Goal: "to  walk without pain" PT Goal Formulation: With patient  Time For Goal Achievement: 01/04/23 Potential to Achieve Goals: Good Progress towards PT goals: Progressing toward goals    Frequency    7X/week      PT Plan Current plan remains appropriate       AM-PAC PT "6 Clicks" Mobility   Outcome Measure  Help needed turning from your back to your side while in a flat bed without using bedrails?: A Little Help needed moving from lying on your back to sitting on the side of a flat bed without using bedrails?: A Little Help needed moving to and from a bed to a chair (including a wheelchair)?: A Little Help needed standing up from a chair using your arms (e.g., wheelchair or bedside chair)?: A Little Help needed to walk in hospital room?: A Little Help needed climbing 3-5 steps with a railing? : A Little 6 Click Score: 18    End of Session Equipment Utilized During Treatment: Gait belt Activity Tolerance: Patient tolerated treatment well Patient left: in chair;with call bell/phone within reach Nurse Communication: Mobility status PT Visit Diagnosis: Other abnormalities of gait and mobility (R26.89);Pain;Difficulty in walking, not elsewhere classified (R26.2) Pain - Right/Left: Right Pain - part of body: Hip     Time: [id number] PT Time Calculation (min) (ACUTE ONLY): 24 min  Charges:  $Gait Training: 8-22 mins $Therapeutic Activity: 8-22 mins                     Tomma Rakers, DPT, [place] Office: [phone] (Secure chat preferred)    Ander Purpura 12/22/2022, 1:46 PM

## 2022-12-22 NOTE — Progress Notes (Signed)
Physical Therapy Treatment Patient Details Name: Barbara Steele MRN: [id number] DOB: [birth year] Today's Date: 12/22/2022   History of Present Illness 62 y.o. female presents to [facility] hospital on 12/21/2022 for elective R THA. PMH includes CVA, OA, HLD, HTN, myasthenia gravis, RA.    PT Comments    Pt was able to navigate stairs per home set up this session at [facility]. She does not have a rail but uses a pillar to hold on to while ascending/descending which pt was able to perform today with very little UE support demonstrating the ability to perform at [facility] per home set up with a little assistance. Due to pt current functional status, home setup and available assistance at home recommending skilled physical therapy services per physician recommendation on discharge in order to decrease risk for falls, injury, immobility and re-hospitalization.  No signs/symptoms of cardiac/respiratory distress throughout session.   Recommendations for follow up therapy are one component of a multi-disciplinary discharge planning process, led by the attending physician.  Recommendations may be updated based on patient status, additional functional criteria and insurance authorization.  Follow Up Recommendations  Follow physician's recommendations for discharge plan and follow up therapies     Assistance Recommended at Discharge PRN  Patient can return home with the following A little help with walking and/or transfers;A little help with bathing/dressing/bathroom;Assistance with cooking/housework;Assist for transportation;Help with stairs or ramp for entrance   Equipment Recommendations  Rolling walker (2 wheels);BSC/3in1    Recommendations for Other Services       Precautions / Restrictions Precautions Precautions: Fall Restrictions Weight Bearing Restrictions: No RLE Weight Bearing: Weight bearing as tolerated     Mobility  Bed Mobility Overal bed mobility: Modified Independent Bed Mobility: Supine to Sit,  Sit to Supine     Supine to sit: Modified independent (Device/Increase time) Sit to supine: Modified independent (Device/Increase time)   General bed mobility comments: Pt requires increased time but did not need physical assistance to get her LE off the bed. Patient Response: Cooperative  Transfers Overall transfer level: Needs assistance Equipment used: Rolling walker (2 wheels) Transfers: Sit to/from Stand Sit to Stand: Supervision           General transfer comment: No physical assist required. Pt does continue to require verbal cues for safe hand placement.    Ambulation/Gait Ambulation/Gait assistance: Min guard Gait Distance (Feet): 10 Feet Assistive device: Rolling walker (2 wheels) Gait Pattern/deviations: Decreased stride length, Decreased weight shift to right, Decreased stance time - right, Step-to pattern, Antalgic Gait velocity: decreased cadence Gait velocity interpretation: <1.8 ft/sec, indicate of risk for recurrent falls   General Gait Details: continue with low foot clearance and step to to partial step through gait pattern.   Stairs Stairs: Yes Stairs assistance: Min guard Stair Management: One rail Right, Sideways Number of Stairs: 3 General stair comments: Pt was able to navigate stairs per home set up at [facility]. She was ascending/descending stairs sideways prior to hospitalization due to previous CVA.        Balance Overall balance assessment: Needs assistance Sitting-balance support: Feet supported Sitting balance-Leahy Scale: Normal     Standing balance support: Single extremity supported, Bilateral upper extremity supported Standing balance-Leahy Scale: Fair Standing balance comment: No overt LOB        Cognition Arousal/Alertness: Awake/alert Behavior During Therapy: WFL for tasks assessed/performed Overall Cognitive Status: Within Functional Limits for tasks assessed          General Comments General comments (skin integrity,  edema, etc.):  Continues to need extra time to perform activities. Will need daughters assistance to navigate stairs per home set up at [facility].      Pertinent Vitals/Pain Pain Assessment Pain Assessment: Faces Faces Pain Scale: Hurts little more Pain Location: R hip Pain Descriptors / Indicators: Operative site guarding, Discomfort, Grimacing Pain Intervention(s): Monitored during session, Patient requesting pain meds-RN notified, Ice applied     PT Goals (current goals can now be found in the care plan section) Acute Rehab PT Goals Patient Stated Goal: "to walk without pain" PT Goal Formulation: With patient Time For Goal Achievement: 01/04/23 Potential to Achieve Goals: Good Progress towards PT goals: Progressing toward goals    Frequency    7X/week      PT Plan Current plan remains appropriate       AM-PAC PT "6 Clicks" Mobility   Outcome Measure  Help needed turning from your back to your side while in a flat bed without using bedrails?: A Little Help needed moving from lying on your back to sitting on the side of a flat bed without using bedrails?: A Little Help needed moving to and from a bed to a chair (including a wheelchair)?: A Little Help needed standing up from a chair using your arms (e.g., wheelchair or bedside chair)?: A Little Help needed to walk in hospital room?: A Little Help needed climbing 3-5 steps with a railing? : A Little 6 Click Score: 18    End of Session Equipment Utilized During Treatment: Gait belt Activity Tolerance: Patient tolerated treatment well Patient left: in chair;with call bell/phone within reach Nurse Communication: Mobility status PT Visit Diagnosis: Other abnormalities of gait and mobility (R26.89);Pain;Difficulty in walking, not elsewhere classified (R26.2) Pain - Right/Left: Right Pain - part of body: Hip     Time: 1253-1316 PT Time Calculation (min) (ACUTE ONLY): 23 min  Charges:  $Gait Training: 8-22  mins $Therapeutic Activity: 8-22 mins                     Tomma Rakers, DPT, [place] Office: [phone] (Secure chat preferred)    Ander Purpura 12/22/2022, 1:51 PM

## 2022-12-22 NOTE — Discharge Instructions (Signed)

## 2022-12-22 NOTE — Progress Notes (Signed)
Subjective: 1 Day Post-Op Procedure(s) (LRB): RIGHT TOTAL HIP ARTHROPLASTY ANTERIOR APPROACH (Right) Patient reports pain as moderate.    Objective: Vital signs in last 24 hours: Temp:  [98 F (36.7 C)-98.7 F (37.1 C)] 98.6 F (37 C) (03/20 0406) Pulse Rate:  [64-83] 80 (03/20 0406) Resp:  [11-20] 17 (03/20 0406) BP: (110-153)/(55-97) 128/67 (03/20 0406) SpO2:  [92 %-99 %] 95 % (03/20 0406) Weight:  [81.6 kg] 81.6 kg (03/19 0804)  Intake/Output from previous day: 03/19 0701 - 03/20 0700 In: 1533.3 [P.O.:120; I.V.:931.1; IV Piggyback:482.2] Out: 2275 [Urine:2125; Blood:150] Intake/Output this shift: No intake/output data recorded.  Recent Labs    12/22/22 0214  HGB 12.1   Recent Labs    12/22/22 0214  WBC 13.2*  RBC 4.43  HCT 37.1  PLT 246   Recent Labs    12/22/22 0214  NA 139  K 3.5  CL 108  CO2 23  BUN 9  CREATININE 0.72  GLUCOSE 143*  CALCIUM 8.3*   No results for input(s): "LABPT", "INR" in the last 72 hours.  Sensation intact distally Intact pulses distally Dorsiflexion/Plantar flexion intact Incision: dressing C/D/I   Assessment/Plan: 1 Day Post-Op Procedure(s) (LRB): RIGHT TOTAL HIP ARTHROPLASTY ANTERIOR APPROACH (Right) Up with therapy Discharge home with home health this afternoon if clears therapy.      Mcarthur Rossetti 12/22/2022, 7:53 AM

## 2022-12-22 NOTE — Plan of Care (Signed)
  Problem: Activity: Goal: Ability to tolerate increased activity will improve Outcome: Progressing   Problem: Clinical Measurements: Goal: Postoperative complications will be avoided or minimized Outcome: Progressing   Problem: Pain Management: Goal: Pain level will decrease with appropriate interventions Outcome: Progressing   Problem: Skin Integrity: Goal: Will show signs of wound healing Outcome: Progressing   Problem: Education: Goal: Knowledge of General Education information will improve Description: Including pain rating scale, medication(s)/side effects and non-pharmacologic comfort measures Outcome: Progressing   Problem: Clinical Measurements: Goal: Respiratory complications will improve Outcome: Progressing   Problem: Activity: Goal: Risk for activity intolerance will decrease Outcome: Progressing   Problem: Nutrition: Goal: Adequate nutrition will be maintained Outcome: Progressing   Problem: Pain Managment: Goal: General experience of comfort will improve Outcome: Progressing   Problem: Safety: Goal: Ability to remain free from injury will improve Outcome: Progressing   Problem: Skin Integrity: Goal: Risk for impaired skin integrity will decrease Outcome: Progressing

## 2022-12-23 DIAGNOSIS — M1611 Unilateral primary osteoarthritis, right hip: Secondary | ICD-10-CM | POA: Diagnosis not present

## 2022-12-23 NOTE — Discharge Summary (Signed)
Patient ID: Barbara Steele MRN: [id number] DOB/AGE: 1961/01/18 62 y.o.  Admit date: 12/21/2022 Discharge date: 12/23/2022  Admission Diagnoses:  Principal Problem:   Unilateral primary osteoarthritis, right hip Active Problems:   Status post total replacement of right hip   Discharge Diagnoses:  Same  Past Medical History:  Diagnosis Date   Acute ischemic stroke ([place]) 07/24/2020   Anemia    Arthritis    CVA (cerebral vascular accident) ([place])    High cholesterol    Hypertension    Intracranial aneurysm 01/05/2021   Mental disorder    Myasthenia gravis ([place])    Rheumatoid arthritis ([place])    Stroke [facility])     Surgeries: Procedure(s): RIGHT TOTAL HIP ARTHROPLASTY ANTERIOR APPROACH on 12/21/2022   Consultants:   Discharged Condition: Improved  Hospital Course: Barbara Steele is an 62 y.o. female who was admitted 12/21/2022 for operative treatment ofUnilateral primary osteoarthritis, right hip. Patient has severe unremitting pain that affects sleep, daily activities, and work/hobbies. After pre-op clearance the patient was taken to the operating room on 12/21/2022 and underwent  Procedure(s): RIGHT TOTAL HIP ARTHROPLASTY ANTERIOR APPROACH.    Patient was given perioperative antibiotics:  Anti-infectives (From admission, onward)    Start     Dose/Rate Route Frequency Ordered Stop   12/21/22 1600  ceFAZolin (ANCEF) IVPB 1 g/50 mL premix        1 g 100 mL/hr over 30 Minutes Intravenous Every 6 hours 12/21/22 1523 12/21/22 2259   12/21/22 0800  ceFAZolin (ANCEF) IVPB 2g/100 mL premix        2 g 200 mL/hr over 30 Minutes Intravenous On call to O.R. 12/21/22 0746 12/21/22 1020        Patient was given sequential compression devices, early ambulation, and chemoprophylaxis to prevent DVT.  Patient benefited maximally from hospital stay and there were no complications.    Recent vital signs: Patient Vitals for the past 24 hrs:  BP Temp Temp src Pulse Resp SpO2  12/23/22  0814 (!) 141/74 99.3 F (37.4 C) Oral 84 15 (!) 89 %  12/23/22 0441 127/61 98.5 F (36.9 C) -- 90 17 91 %  12/22/22 1948 (!) 141/67 98.4 F (36.9 C) Oral 80 18 93 %  12/22/22 1602 (!) 133/54 98.6 F (37 C) Oral 86 18 93 %  12/22/22 1319 (!) 141/65 98.4 F (36.9 C) Oral 86 17 98 %     Recent laboratory studies:  Recent Labs    12/22/22 0214  WBC 13.2*  HGB 12.1  HCT 37.1  PLT 246  NA 139  K 3.5  CL 108  CO2 23  BUN 9  CREATININE 0.72  GLUCOSE 143*  CALCIUM 8.3*     Discharge Medications:   Allergies as of 12/23/2022   No Known Allergies      Medication List     STOP taking these medications    aspirin EC 81 MG tablet Replaced by: aspirin 81 MG chewable tablet       TAKE these medications    acetaminophen 500 MG tablet Commonly known as: TYLENOL Take 500 mg by mouth every 6 (six) hours as needed for moderate pain.   amLODipine 10 MG tablet Commonly known as: NORVASC TAKE 1 TABLET(10 MG) BY MOUTH DAILY   aspirin 81 MG chewable tablet Chew 1 tablet (81 mg total) by mouth 2 (two) times daily. Replaces: aspirin EC 81 MG tablet   atorvastatin 40 MG tablet Commonly known as: LIPITOR TAKE 1 TABLET(40 MG) BY MOUTH DAILY  methocarbamol 500 MG tablet Commonly known as: ROBAXIN Take 1 tablet (500 mg total) by mouth every 6 (six) hours as needed for muscle spasms.   oxyCODONE 5 MG immediate release tablet Commonly known as: Oxy IR/ROXICODONE Take 1-2 tablets (5-10 mg total) by mouth every 6 (six) hours as needed for moderate pain (pain score 4-6).   pyridostigmine 60 MG tablet Commonly known as: MESTINON Take 1 tablet (60 mg total) by mouth 3 (three) times daily.   sertraline 25 MG tablet Commonly known as: ZOLOFT Take 1 tablet (25 mg total) by mouth daily.   Vitamin D (Ergocalciferol) 1.25 MG (50000 UNIT) Caps capsule Commonly known as: DRISDOL TAKE 1 CAPSULE BY MOUTH EVERY 7 DAYS               Durable Medical Equipment  (From  admission, onward)           Start     Ordered   12/21/22 1558  For home use only DME Bedside commode  Once       Question:  Patient needs a bedside commode to treat with the following condition  Answer:  Weakness   12/21/22 1558   12/21/22 1524  DME 3 n 1  Once        12/21/22 1523   12/21/22 1524  DME Walker rolling  Once       Question Answer Comment  Walker: With 5 Inch Wheels   Patient needs a walker to treat with the following condition Status post total replacement of right hip      12/21/22 1523            Diagnostic Studies: DG Pelvis Portable  Result Date: 12/21/2022 CLINICAL DATA:  Postop right hip EXAM: PORTABLE PELVIS 1 VIEWS COMPARISON:  Preop x-ray 05/15/2022 FINDINGS: Single portable view of the pelvis demonstrates evidence of right hip hemiarthroplasty with Press-Fit femoral and acetabular components. Expected alignment. Lateral skin staples with scattered soft tissue gas. Expected alignment. Elsewhere there is injury joint space loss of the left hip. No fracture or dislocation. The extreme upper aspect of the pelvis is clipped off the edge of the film. Imaging was obtained to aid in treatment. IMPRESSION: Acute surgical changes of right hip hemiarthroplasty Electronically Signed   By: Jill Side M.D.   On: 12/21/2022 12:09   DG HIP UNILAT WITH PELVIS 1V RIGHT  Result Date: 12/21/2022 CLINICAL DATA:  Fluoro guidance provided EXAM: DG HIP (WITH OR WITHOUT PELVIS) 1V RIGHT FINDINGS: Dose: 2.9 mGy Fluoro time 24s IMPRESSION: C-arm fluoro guidance provided. Electronically Signed   By: Sammie Bench M.D.   On: 12/21/2022 11:43   DG C-Arm 1-60 Min-No Report  Result Date: 12/21/2022 Fluoroscopy was utilized by the requesting physician.  No radiographic interpretation.    Disposition: Discharge disposition: 01-Home or Self Care          Follow-up Information     Mcarthur Rossetti, MD Follow up in 2 week(s).   Specialty: Orthopedic Surgery Contact  information: [address] [place] [address] [phone]                  Signed: Mcarthur Rossetti 12/23/2022, 10:06 AM

## 2022-12-23 NOTE — Progress Notes (Signed)
Physical Therapy Treatment Patient Details Name: Barbara Steele MRN: [id number] DOB: 08/03/1961 Today's Date: 12/23/2022   History of Present Illness 62 y.o. female presents to [facility] hospital on 12/21/2022 for elective R THA. PMH includes CVA, OA, HLD, HTN, myasthenia gravis, RA.    PT Comments    Pt was received sitting EOB and agreeable to session. Pt was able to tolerate increased gait distance this session with up to supervision for safety. Pt reporting shooting pain in R hip with RLE WB, but no unsteadiness noted during gait trial. Pt able to stand from lowered EOB and recliner with min guard-supervision. Reviewed HEP with pt and educated on the benefits. Pt deferred stair trial reporting that she feels comfortable navigating the stairs into her home. Anticipate pt and daughter will be able to manage pt's mobility needs at home.    Recommendations for follow up therapy are one component of a multi-disciplinary discharge planning process, led by the attending physician.  Recommendations may be updated based on patient status, additional functional criteria and insurance authorization.  Follow Up Recommendations  Follow physician's recommendations for discharge plan and follow up therapies     Assistance Recommended at Discharge PRN  Patient can return home with the following A little help with walking and/or transfers;A little help with bathing/dressing/bathroom;Assistance with cooking/housework;Assist for transportation;Help with stairs or ramp for entrance   Equipment Recommendations  Rolling walker (2 wheels);BSC/3in1    Recommendations for Other Services       Precautions / Restrictions Precautions Precautions: Fall Restrictions Weight Bearing Restrictions: No RLE Weight Bearing: Weight bearing as tolerated     Mobility  Bed Mobility Overal bed mobility: Modified Independent             General bed mobility comments: Pt sitting EOB upon arrival    Transfers Overall  transfer level: Needs assistance Equipment used: Rolling walker (2 wheels) Transfers: Sit to/from Stand Sit to Stand: Min guard, Supervision           General transfer comment: Pt initially min guard and required increased time to reach upright posture from EOB, however progressing to supervision from recliner.    Ambulation/Gait Ambulation/Gait assistance: Supervision Gait Distance (Feet): 120 Feet Assistive device: Rolling walker (2 wheels) Gait Pattern/deviations: Decreased stride length, Decreased weight shift to right, Decreased stance time - right, Antalgic, Step-through pattern       General Gait Details: Pt demonstrating antalgic step-through pattern with no noted unsteadiness with RW support. Pt required cues for upright posture.   Stairs         General stair comments: Pt deferred stair trial stating that she feels comfortable navigating the stairs into her home.       Balance Overall balance assessment: Needs assistance Sitting-balance support: Feet supported Sitting balance-Leahy Scale: Normal Sitting balance - Comments: sitting EOB   Standing balance support: Bilateral upper extremity supported, During functional activity Standing balance-Leahy Scale: Fair Standing balance comment: with RW support                            Cognition Arousal/Alertness: Awake/alert Behavior During Therapy: WFL for tasks assessed/performed Overall Cognitive Status: Within Functional Limits for tasks assessed                                          Exercises Total Joint Exercises Ankle Circles/Pumps: AROM,  Left, 10 reps, Supine Quad Sets: Right, 10 reps, Supine    General Comments General comments (skin integrity, edema, etc.): Pt reports her daughter has taken off of work for the next week to provide 24/7 assistance once discharged.      Pertinent Vitals/Pain Pain Assessment Pain Assessment: Faces Faces Pain Scale: Hurts little  more Pain Location: R hip Pain Descriptors / Indicators: Operative site guarding, Discomfort, Grimacing, Shooting Pain Intervention(s): Limited activity within patient's tolerance, Monitored during session, Repositioned     PT Goals (current goals can now be found in the care plan section) Acute Rehab PT Goals Patient Stated Goal: "to walk without pain" PT Goal Formulation: With patient Time For Goal Achievement: 01/04/23 Potential to Achieve Goals: Good Progress towards PT goals: Progressing toward goals    Frequency    7X/week      PT Plan Current plan remains appropriate       AM-PAC PT "6 Clicks" Mobility   Outcome Measure  Help needed turning from your back to your side while in a flat bed without using bedrails?: None Help needed moving from lying on your back to sitting on the side of a flat bed without using bedrails?: None Help needed moving to and from a bed to a chair (including a wheelchair)?: A Little Help needed standing up from a chair using your arms (e.g., wheelchair or bedside chair)?: A Little Help needed to walk in hospital room?: A Little Help needed climbing 3-5 steps with a railing? : A Little 6 Click Score: 20    End of Session Equipment Utilized During Treatment: Gait belt Activity Tolerance: Patient tolerated treatment well Patient left: in chair;with call bell/phone within reach Nurse Communication: Mobility status PT Visit Diagnosis: Other abnormalities of gait and mobility (R26.89);Pain;Difficulty in walking, not elsewhere classified (R26.2)     Time: [id number] PT Time Calculation (min) (ACUTE ONLY): 23 min  Charges:  $Gait Training: 23-37 mins                     Michelle Nasuti, PTA Acute Rehabilitation Services Secure Chat Preferred  [phone]    Michelle Nasuti 12/23/2022, 10:48 AM

## 2022-12-23 NOTE — Progress Notes (Signed)
Discharge instructions given. Patient verbalized understanding and all questions were answered.  ?

## 2022-12-23 NOTE — Progress Notes (Signed)
Subjective: 2 Days Post-Op Procedure(s) (LRB): RIGHT TOTAL HIP ARTHROPLASTY ANTERIOR APPROACH (Right) Patient reports pain as moderate.    Objective: Vital signs in last 24 hours: Temp:  [98.4 F (36.9 C)-99.3 F (37.4 C)] 99.3 F (37.4 C) (03/21 0814) Pulse Rate:  [80-90] 84 (03/21 0814) Resp:  [15-18] 15 (03/21 0814) BP: (127-141)/(54-74) 141/74 (03/21 0814) SpO2:  [89 %-98 %] 89 % (03/21 0814)  Intake/Output from previous day: 03/20 0701 - 03/21 0700 In: 240 [P.O.:240] Out: -  Intake/Output this shift: No intake/output data recorded.  Recent Labs    12/22/22 0214  HGB 12.1   Recent Labs    12/22/22 0214  WBC 13.2*  RBC 4.43  HCT 37.1  PLT 246   Recent Labs    12/22/22 0214  NA 139  K 3.5  CL 108  CO2 23  BUN 9  CREATININE 0.72  GLUCOSE 143*  CALCIUM 8.3*   No results for input(s): "LABPT", "INR" in the last 72 hours.  Sensation intact distally Intact pulses distally Dorsiflexion/Plantar flexion intact Incision: dressing C/D/I   Assessment/Plan: 2 Days Post-Op Procedure(s) (LRB): RIGHT TOTAL HIP ARTHROPLASTY ANTERIOR APPROACH (Right) Discharge home with home health      Mcarthur Rossetti 12/23/2022, 10:05 AM

## 2022-12-28 ENCOUNTER — Telehealth: Payer: Self-pay | Admitting: Orthopaedic Surgery

## 2022-12-28 NOTE — Telephone Encounter (Signed)
Need a verbal order for home P/T for 2wx2, 1wx1( [place]) [facility] home [phone]

## 2022-12-28 NOTE — Telephone Encounter (Signed)
Verbal order given  

## 2023-01-03 ENCOUNTER — Ambulatory Visit (INDEPENDENT_AMBULATORY_CARE_PROVIDER_SITE_OTHER): Payer: No Typology Code available for payment source | Admitting: Orthopaedic Surgery

## 2023-01-03 ENCOUNTER — Encounter: Payer: Self-pay | Admitting: Orthopaedic Surgery

## 2023-01-03 DIAGNOSIS — Z96641 Presence of right artificial hip joint: Secondary | ICD-10-CM

## 2023-01-03 NOTE — Progress Notes (Signed)
The patient is here for first postoperative visit status post a right total hip arthroplasty.  She is already off of pain medications.  She has been compliant with a baby aspirin twice a day.  She is ambulating with a walker today but home therapy is working with her transitioning to a cane.  She denies any issues at all.  Her right hip incision looks good.  The staples are removed and Steri-Strips applied.  Her leg lengths feel equal.  She denies any knee issues.  She will slowly increase her activities as comfort allows.  She will go back down to just a baby aspirin daily which is what she was on before.  Will see her back in 4 weeks to see how she is doing overall.  All questions and concerns were answered and addressed.

## 2023-02-02 ENCOUNTER — Ambulatory Visit: Payer: No Typology Code available for payment source | Admitting: Orthopaedic Surgery

## 2023-02-02 ENCOUNTER — Encounter: Payer: Self-pay | Admitting: Orthopaedic Surgery

## 2023-02-02 DIAGNOSIS — Z96641 Presence of right artificial hip joint: Secondary | ICD-10-CM

## 2023-02-02 NOTE — Progress Notes (Signed)
The patient is here today at 6 weeks status post a right total hip arthroplasty.  She says that hip is doing great and she has no complaints.  She says it is much better than it was before surgery.  She has no left hip issues.  She is walking without assistive device.  She is a young appearing 62 year old female.  Her left hip moves smoothly and fluidly.  Her right operative hip has just a little bit of stiffness with rotation.  Her leg lengths are equal.  She will continue to increase her activities as comfort allows with no restrictions.  We will see her back in 6 months unless there are issues.  At that visit we will have a standing low AP pelvis and lateral of her right operative hip.  All questions and concerns were addressed and answered.

## 2023-02-15 IMAGING — US US CAROTID DUPLEX BILAT
1 series · 13 of 24 positions shown · non-contrast
Comparison: None.

CLINICAL DATA: Carotid stenosis

Central retinal occlusion
EXAM:
BILATERAL CAROTID DUPLEX ULTRASOUND
TECHNIQUE: Gray scale imaging, color Doppler and duplex ultrasound were
performed of bilateral carotid and vertebral arteries in the neck.

[Series 1: us carotid bilateral · 13 of 68 slices shown]
[im 1/68]
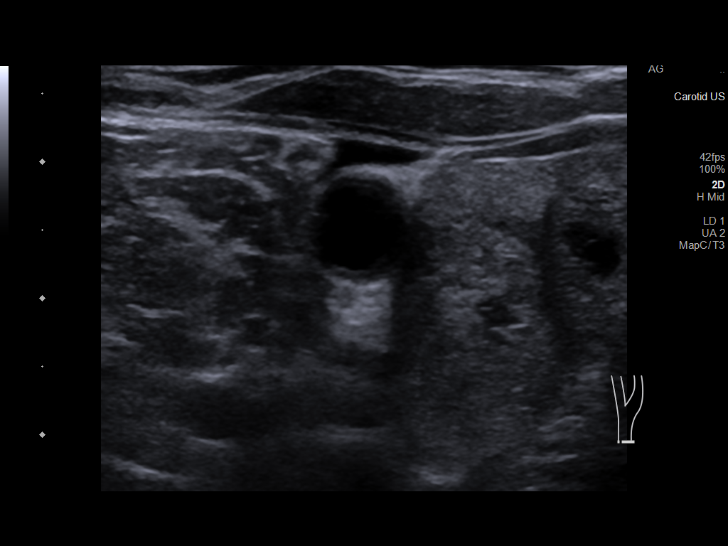
[im 6/68]
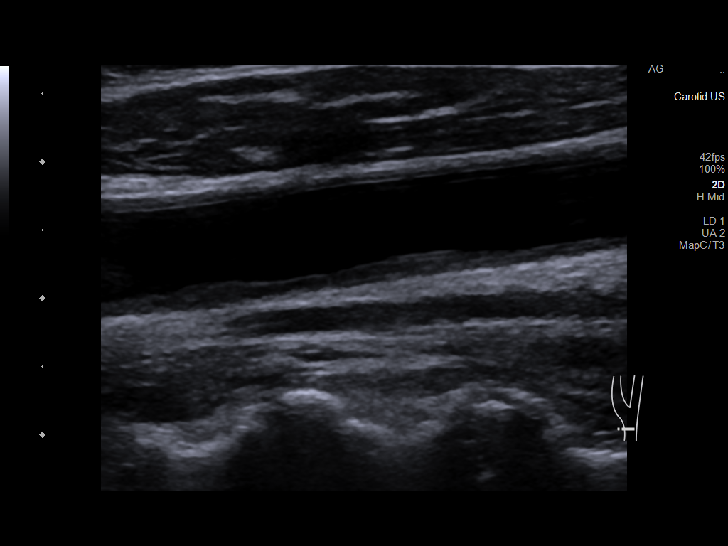
[im 12/68]
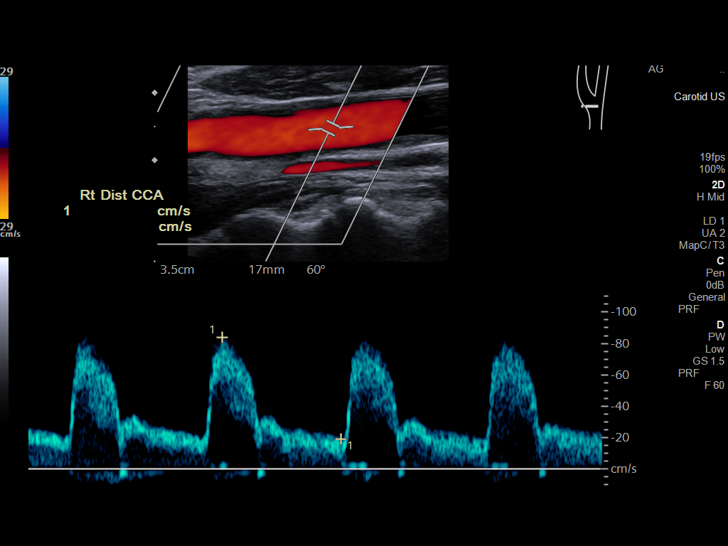
[im 18/68]
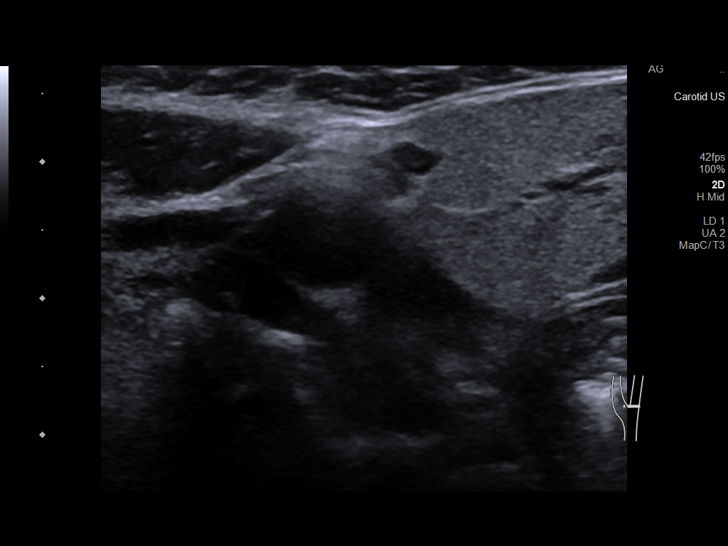
[im 24/68]
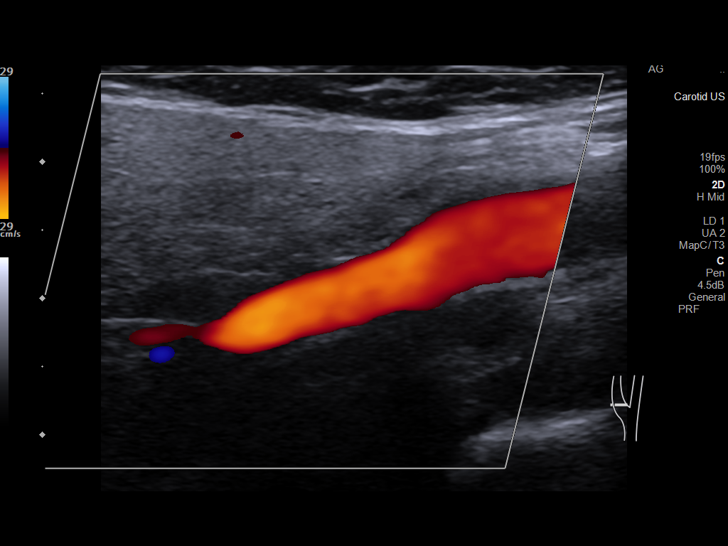
[im 30/68]
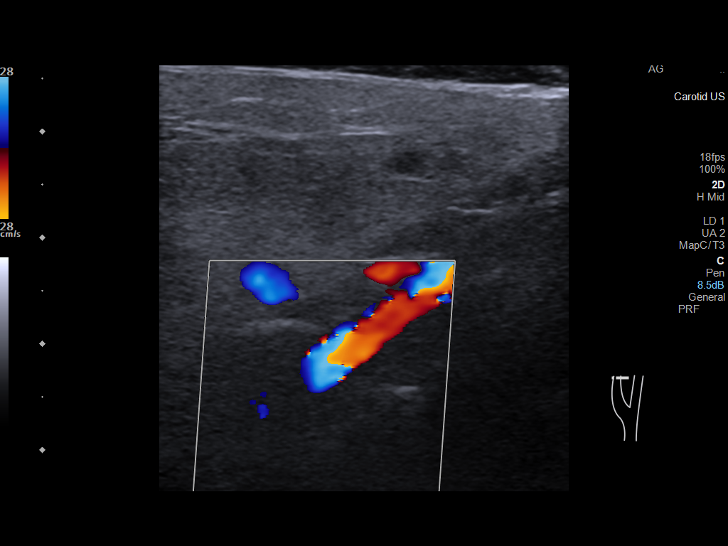
[im 35/68]
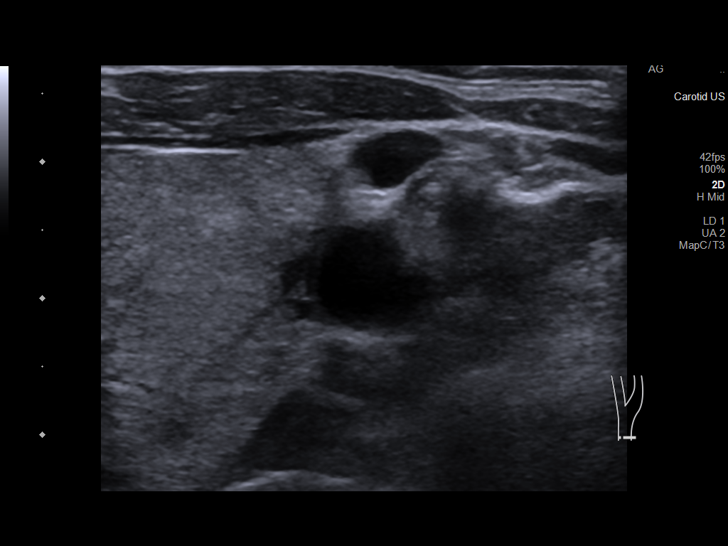
[im 38/68]
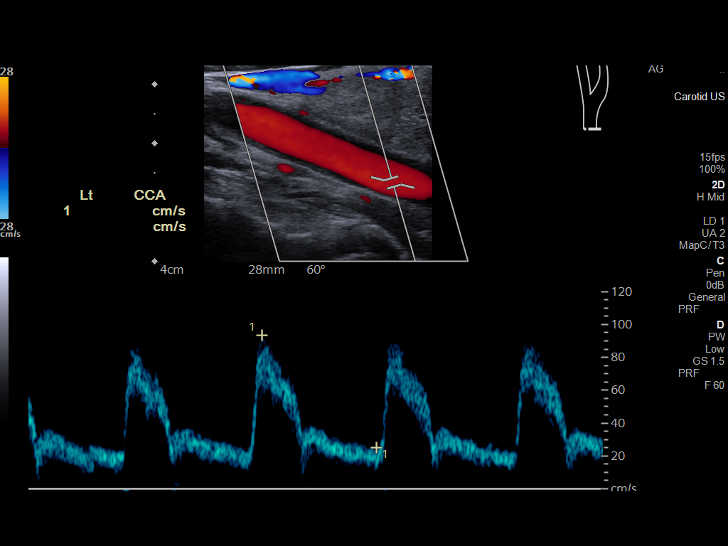
[im 44/68]
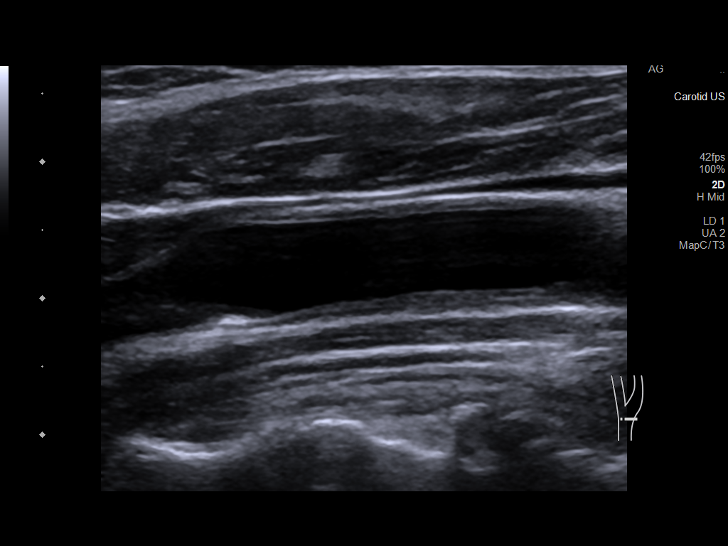
[im 50/68]
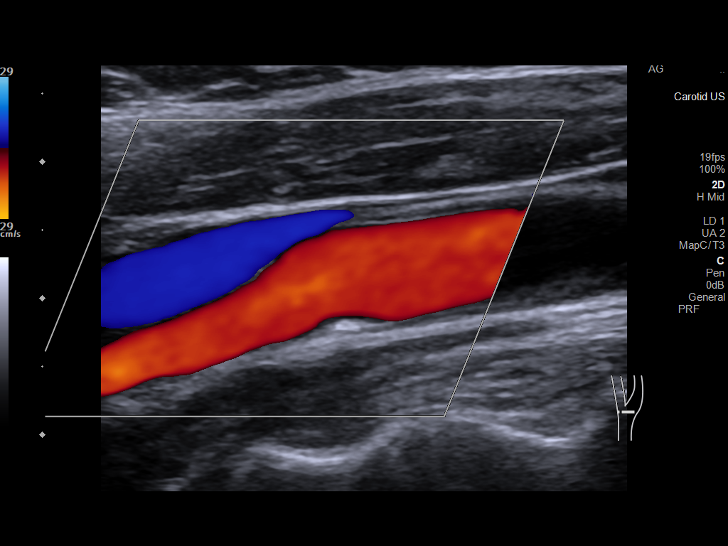
[im 56/68]
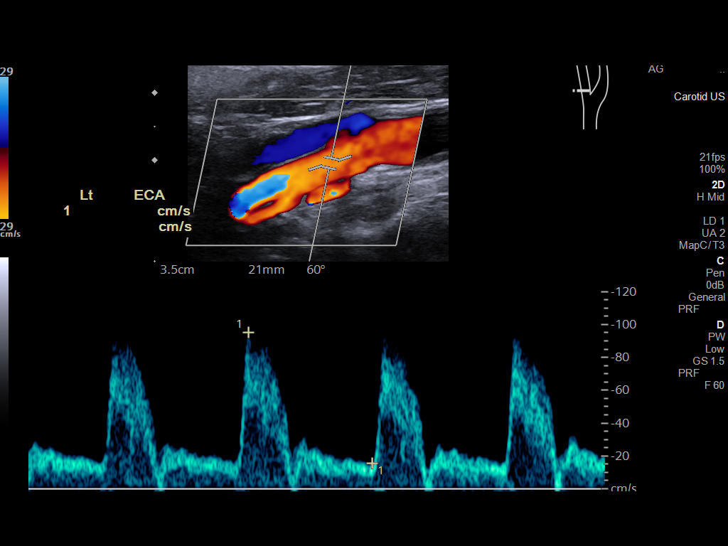
[im 62/68]
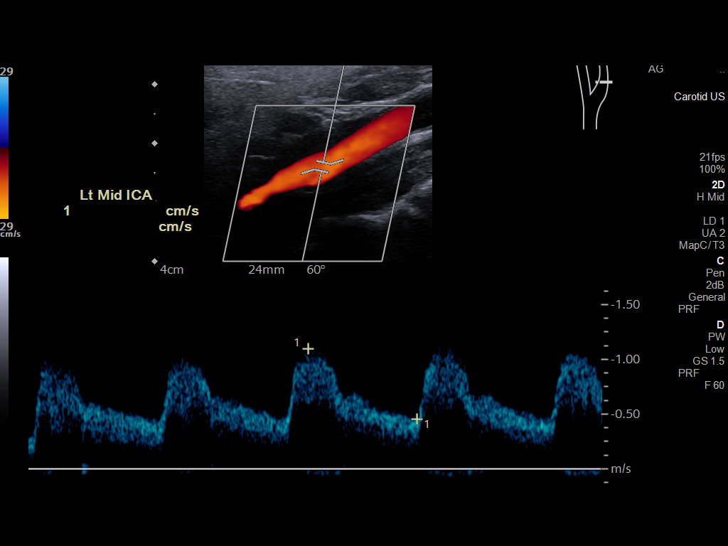
[im 68/68]
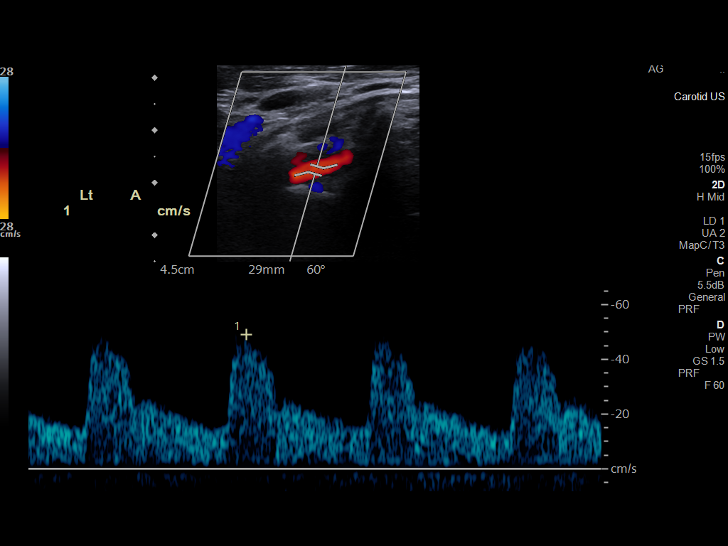

[13 of 24 positions shown; findings below may reference images not displayed]

FINDINGS: Criteria: Quantification of carotid stenosis is based on velocity
parameters that correlate the residual internal carotid diameter
with NASCET-based stenosis levels, using the diameter of the distal
internal carotid lumen as the denominator for stenosis measurement.

The following velocity measurements were obtained:

RIGHT

ICA: 108/40 cm/sec

CCA: 83/22 cm/sec

SYSTOLIC ICA/CCA RATIO:

ECA: 142 cm/sec

LEFT

ICA: 110/45 cm/sec

CCA: 99/26 cm/sec

SYSTOLIC ICA/CCA RATIO:

ECA: 95 cm/sec

RIGHT CAROTID ARTERY: Borderline elevated end-diastolic velocity is
likely artifact given only minimal noncalcified plaque of the right
internal carotid artery origin.

RIGHT VERTEBRAL ARTERY:  Antegrade flow.

LEFT CAROTID ARTERY: Borderline elevated end-diastolic velocity
likely artifact given only minimal calcified plaque at the carotid
bifurcation.

LEFT VERTEBRAL ARTERY:  Antegrade flow.
IMPRESSION: Less than 50% stenosis of the internal carotid arteries.

## 2023-02-21 ENCOUNTER — Telehealth (INDEPENDENT_AMBULATORY_CARE_PROVIDER_SITE_OTHER): Payer: No Typology Code available for payment source | Admitting: Diagnostic Neuroimaging

## 2023-02-21 ENCOUNTER — Encounter: Payer: Self-pay | Admitting: Diagnostic Neuroimaging

## 2023-02-21 DIAGNOSIS — I639 Cerebral infarction, unspecified: Secondary | ICD-10-CM

## 2023-02-21 DIAGNOSIS — G7000 Myasthenia gravis without (acute) exacerbation: Secondary | ICD-10-CM

## 2023-02-21 MED ORDER — PYRIDOSTIGMINE BROMIDE 60 MG PO TABS: 60.0000 mg | ORAL_TABLET | Freq: Three times a day (TID) | ORAL | 4 refills | Status: DC

## 2023-02-21 NOTE — Progress Notes (Signed)
[facility]  PATIENT: Barbara Steele DOB: [birth year]  REFERRING CLINICIAN: Anabel Halon, MD HISTORY FROM: patient  REASON FOR VISIT: follow up    HISTORICAL  CHIEF COMPLAINT:  Chief Complaint  Patient presents with   Myasthenia Gravis    HISTORY OF PRESENT ILLNESS:   UPDATE (02/21/23, VRP): Since last visit, doing well. Symptoms are stable. Severity is mild. No alleviating or aggravating factors. Tolerating mestinon. Pacing herself with work. Had had right hip surgery in March 2024, and doing well.   UPDATE (02/16/22, VRP): Since last visit, doing about the same. Mestinon helping a little bit with stamina. No other new issues.   PRIOR HPI: 62 year old female here for evaluation of increasing fatigue and weakness.  1990 patient had problems with eyes, swallowing, generalized weakness.  She was diagnosed with myasthenia gravis and treated with thymectomy.  She will was also placed on Mestinon and did well.  Years later she was diagnosed with rheumatoid arthritis and she was on prednisone from 1995 until 2000.  Eventually she was able to wean off of prednisone and managed her rheumatoid arthritis and myasthenia gravis conservatively.  2021 patient had right-sided weakness, slurred speech, fatigue.  She went to the hospital was diagnosed with left brain ischemic infarction.  She is also had a stroke to the right eye / retina.  Since then patient continues to have some issues with fatigue and weakness.  She is wondering if her myasthenia gravis has been exacerbated.  And would like to get back on Mestinon.   REVIEW OF SYSTEMS: Full 14 system review of systems performed and negative with exception of: as per HPI.  ALLERGIES: No Known Allergies  HOME MEDICATIONS: Outpatient Medications Prior to Visit  Medication Sig Dispense Refill   acetaminophen (TYLENOL) 500 MG tablet Take 500 mg by mouth every 6 (six) hours as needed for moderate pain.     amLODipine  (NORVASC) 10 MG tablet TAKE 1 TABLET(10 MG) BY MOUTH DAILY 90 tablet 1   aspirin 81 MG chewable tablet Chew 1 tablet (81 mg total) by mouth 2 (two) times daily. 30 tablet 0   atorvastatin (LIPITOR) 40 MG tablet TAKE 1 TABLET(40 MG) BY MOUTH DAILY 30 tablet 11   methocarbamol (ROBAXIN) 500 MG tablet Take 1 tablet (500 mg total) by mouth every 6 (six) hours as needed for muscle spasms. 40 tablet 1   sertraline (ZOLOFT) 25 MG tablet Take 1 tablet (25 mg total) by mouth daily. 90 tablet 1   Vitamin D, Ergocalciferol, (DRISDOL) 1.25 MG (50000 UNIT) CAPS capsule TAKE 1 CAPSULE BY MOUTH EVERY 7 DAYS (Patient not taking: Reported on 12/14/2022) 5 capsule 5   oxyCODONE (OXY IR/ROXICODONE) 5 MG immediate release tablet Take 1-2 tablets (5-10 mg total) by mouth every 6 (six) hours as needed for moderate pain (pain score 4-6). 30 tablet 0   pyridostigmine (MESTINON) 60 MG tablet Take 1 tablet (60 mg total) by mouth 3 (three) times daily. 270 tablet 4   No facility-administered medications prior to visit.    PAST MEDICAL HISTORY: Past Medical History:  Diagnosis Date   Acute ischemic stroke (HCC) 07/24/2020   Anemia    Arthritis    CVA (cerebral vascular accident) (HCC)    High cholesterol    Hypertension    Intracranial aneurysm 01/05/2021   Mental disorder    Myasthenia gravis (HCC)    Rheumatoid arthritis (HCC)    Stroke (HCC)     PAST SURGICAL HISTORY: Past Surgical History:  Procedure Laterality Date   BREAST LUMPECTOMY     CATARACT EXTRACTION Bilateral 01/2022   IR 3D INDEPENDENT WKST  07/17/2021   IR ANGIO INTRA EXTRACRAN SEL COM CAROTID INNOMINATE UNI R MOD SED  07/17/2021   IR ANGIO INTRA EXTRACRAN SEL INTERNAL CAROTID UNI L MOD SED  07/17/2021   IR ANGIO VERTEBRAL SEL VERTEBRAL UNI R MOD SED  07/17/2021   IR US GUIDE VASC ACCESS RIGHT  07/17/2021   MASTECTOMY Bilateral    thymectomy     TOTAL HIP ARTHROPLASTY Right 12/21/2022   Procedure: RIGHT TOTAL HIP ARTHROPLASTY ANTERIOR  APPROACH;  Surgeon: Kathryne Hitch, MD;  Location: [facility] OR;  Service: Orthopedics;  Laterality: Right;    FAMILY HISTORY: Family History  Problem Relation Age of Onset   Stroke Mother    Dementia Mother    Cancer Sister        cervical   Heart disease Brother    Dementia Maternal Grandmother    Stroke Maternal Grandfather    Heart attack Paternal Grandfather     SOCIAL HISTORY: Social History   Socioeconomic History   Marital status: Divorced    Spouse name: Not on file   Number of children: Not on file   Years of education: Not on file   Highest education level: Not on file  Occupational History   Not on file  Tobacco Use   Smoking status: Never   Smokeless tobacco: Never  Vaping Use   Vaping Use: Never used  Substance and Sexual Activity   Alcohol use: Not Currently   Drug use: Never   Sexual activity: Not Currently    Birth control/protection: Post-menopausal  Other Topics Concern   Not on file  Social History Narrative   Not on file   Social Determinants of Health   Financial Resource Strain: Low Risk  (09/18/2020)   Overall Financial Resource Strain (CARDIA)    Difficulty of Paying Living Expenses: Not hard at all  Food Insecurity: No Food Insecurity (12/21/2022)   Hunger Vital Sign    Worried About Running Out of Food in the Last Year: Never true    Ran Out of Food in the Last Year: Never true  Transportation Needs: No Transportation Needs (12/21/2022)   PRAPARE - Administrator, Civil Service (Medical): No    Lack of Transportation (Non-Medical): No  Physical Activity: Inactive (09/18/2020)   Exercise Vital Sign    Days of Exercise per Week: 0 days    Minutes of Exercise per Session: 0 min  Stress: Stress Concern Present (09/18/2020)   [employer] of Occupational Health - Occupational Stress Questionnaire    Feeling of Stress : Very much  Social Connections: Moderately Isolated (09/18/2020)   Social Connection and Isolation  Panel [NHANES]    Frequency of Communication with Friends and Family: More than three times a week    Frequency of Social Gatherings with Friends and Family: Never    Attends Religious Services: 1 to 4 times per year    Active Member of [employer] or Organizations: No    Attends Banker Meetings: Never    Marital Status: Divorced  Catering manager Violence: Not At Risk (12/21/2022)   Humiliation, Afraid, Rape, and Kick questionnaire    Fear of Current or Ex-Partner: No    Emotionally Abused: No    Physically Abused: No    Sexually Abused: No     PHYSICAL EXAM  GENERAL EXAM/CONSTITUTIONAL: Vitals:  There were no vitals  filed for this visit.  There is no height or weight on file to calculate BMI. Wt Readings from Last 3 Encounters:  12/21/22 180 lb (81.6 kg)  12/15/22 179 lb 4.8 oz (81.3 kg)  09/21/22 174 lb (78.9 kg)   Patient is in no distress; well developed, nourished and groomed; neck is supple  CARDIOVASCULAR: Examination of carotid arteries is normal; no carotid bruits Regular rate and rhythm, no murmurs Examination of peripheral vascular system by observation and palpation is normal  EYES: Ophthalmoscopic exam of optic discs and posterior segments is normal; no papilledema or hemorrhages No results found.  MUSCULOSKELETAL: Gait, strength, tone, movements noted in Neurologic exam below  NEUROLOGIC: MENTAL STATUS:      No data to display         awake, alert, oriented to person, place and time recent and remote memory intact normal attention and concentration language fluent, comprehension intact, naming intact fund of knowledge appropriate  CRANIAL NERVE:  2nd - no papilledema on fundoscopic exam 2nd, 3rd, 4th, 6th - pupils equal and reactive to light, visual fields full to confrontation, extraocular muscles intact, no nystagmus 5th - facial sensation symmetric 7th - facial strength symmetric; EXCEPT SLIGHT DECR NL FOLD ON RIGHT 8th - hearing  intact 9th - palate elevates symmetrically, uvula midline 11th - shoulder shrug symmetric 12th - tongue protrusion midline MILD SLURRED SPEECH  MOTOR:  normal bulk and tone, full strength in the BUE, BLE; EXCEPT SLIGHT HESITATION IN RUE AND RLE  SENSORY:  normal and symmetric to light touch, temperature, vibration  COORDINATION:  finger-nose-finger, fine finger movements normal  REFLEXES:  deep tendon reflexes 1+ and symmetric  GAIT/STATION:  narrow based gait     DIAGNOSTIC DATA (LABS, IMAGING, TESTING) - I reviewed patient records, labs, notes, testing and imaging myself where available.  Lab Results  Component Value Date   WBC 13.2 (H) 12/22/2022   HGB 12.1 12/22/2022   HCT 37.1 12/22/2022   MCV 83.7 12/22/2022   PLT 246 12/22/2022      Component Value Date/Time   NA 139 12/22/2022 0214   NA 145 (H) 02/26/2022 0921   K 3.5 12/22/2022 0214   CL 108 12/22/2022 0214   CO2 23 12/22/2022 0214   GLUCOSE 143 (H) 12/22/2022 0214   BUN 9 12/22/2022 0214   BUN 9 02/26/2022 0921   CREATININE 0.72 12/22/2022 0214   CALCIUM 8.3 (L) 12/22/2022 0214   PROT 8.1 12/15/2022 1430   PROT 8.3 08/05/2021 1616   ALBUMIN 3.9 12/15/2022 1430   ALBUMIN 4.5 08/05/2021 1616   AST 21 12/15/2022 1430   ALT 22 12/15/2022 1430   ALKPHOS 125 12/15/2022 1430   BILITOT 0.2 (L) 12/15/2022 1430   BILITOT 0.3 08/05/2021 1616   GFRNONAA >60 12/22/2022 0214   Lab Results  Component Value Date   CHOL 161 08/05/2021   HDL 66 08/05/2021   LDLCALC 79 08/05/2021   TRIG 85 08/05/2021   CHOLHDL 2.4 08/05/2021   Lab Results  Component Value Date   HGBA1C 5.2 08/05/2021   No results found for: "VITAMINB12" Lab Results  Component Value Date   TSH 0.501 08/05/2021    07/24/20 MRI / MRA head MRI brain: 1. 2.3 cm acute/early subacute infarct within the left corona radiata/lentiform nucleus. 2. Background mild cerebral white matter chronic small vessel ischemic disease.   MRA  head: 1. No intracranial large vessel occlusion. 2. Intracranial atherosclerotic disease with multifocal stenoses, most notably as follows. 3. Severe stenosis  within the distal P2 right posterior cerebral artery. 4. Severe stenosis within the proximal P2 left posterior cerebral artery. 5. Additional multifocal high-grade stenoses more distally within the left posterior cerebral artery within the P2 segment and P2/P3 junction. 6. 2 mm superiorly projecting aneurysm arising from a proximal left M2 MCA branch vessel.  07/17/21 cerebral angiogram 1. No evidence of intracranial aneurysm. Image described as an aneurysm on prior MR angiogram appear to correspond to a mildly ectatic vessel loop at the left M2/MCA superior division branching point. 2. Intracranial atherosclerotic disease with mild stenosis at the right P1/PCA segment, moderate stenosis at the left P1/PCA segment, moderate stenosis at the bilateral P2/PCA segments of and at the left P3/PCA segment. 3. Moderate stenosis at the origin of the left MCA anterior temporal branch and at the origin of a branching vessel from the left MCA superior division. 4. Mild stenosis of the mid right M2/MCA posterior division branch and short segment of severe stenosis at a distal right M3/MCA superior division branch.  08/14/21 carotid u/s - Less than 50% stenosis of the internal carotid arteries.   ASSESSMENT AND PLAN  62 y.o. year old female here with:   Dx:  1. Myasthenia gravis (HCC)     PLAN:  MYASTHENIA GRAVIS (generalized; s/p thymectomy in 1990's) - continue pyridostigmine 60mg  three times a day   STROKE (accelerated intracranial atherosclerosis, hypertension) - continue aspirin 81mg  , amlodipine 10mg , atorvastatin 40mg  daily - reviewed nutrition, exercise activities  Meds ordered this encounter  Medications   pyridostigmine (MESTINON) 60 MG tablet    Sig: Take 1 tablet (60 mg total) by mouth 3 (three) times  daily.    Dispense:  270 tablet    Refill:  4   Return in about 1 year (around 02/21/2024).  Virtual Visit via Video Note  I connected with Raynelle Chary on 02/21/23 at  1:45 PM EDT by a video enabled telemedicine application and verified that I am speaking with the correct person using two identifiers.   I discussed the limitations of evaluation and management by telemedicine and the availability of in person appointments. The patient expressed understanding and agreed to proceed.  Patient is at home and I am at the office.   I spent 15 minutes of face-to-face and non-face-to-face time with patient.  This included previsit chart review, lab review, study review, order entry, electronic health record documentation, patient education.      Suanne Marker, MD 02/21/2023, 1:47 PM Certified in Neurology, Neurophysiology and Neuroimaging  [facility] [address] [place], [place] [address] [phone]

## 2023-03-14 ENCOUNTER — Ambulatory Visit: Payer: No Typology Code available for payment source | Admitting: Internal Medicine

## 2023-03-14 ENCOUNTER — Encounter: Payer: Self-pay | Admitting: Internal Medicine

## 2023-03-14 VITALS — BP 138/82 | HR 87 | Ht 67.0 in | Wt 177.6 lb

## 2023-03-14 DIAGNOSIS — I10 Essential (primary) hypertension: Secondary | ICD-10-CM | POA: Diagnosis not present

## 2023-03-14 DIAGNOSIS — G7000 Myasthenia gravis without (acute) exacerbation: Secondary | ICD-10-CM

## 2023-03-14 DIAGNOSIS — Z1211 Encounter for screening for malignant neoplasm of colon: Secondary | ICD-10-CM

## 2023-03-14 DIAGNOSIS — M069 Rheumatoid arthritis, unspecified: Secondary | ICD-10-CM | POA: Diagnosis not present

## 2023-03-14 DIAGNOSIS — F332 Major depressive disorder, recurrent severe without psychotic features: Secondary | ICD-10-CM

## 2023-03-14 DIAGNOSIS — E782 Mixed hyperlipidemia: Secondary | ICD-10-CM | POA: Insufficient documentation

## 2023-03-14 DIAGNOSIS — R739 Hyperglycemia, unspecified: Secondary | ICD-10-CM

## 2023-03-14 DIAGNOSIS — Z8673 Personal history of transient ischemic attack (TIA), and cerebral infarction without residual deficits: Secondary | ICD-10-CM

## 2023-03-14 MED ORDER — SERTRALINE HCL 50 MG PO TABS: 50.0000 mg | ORAL_TABLET | Freq: Every day | ORAL | 1 refills | Status: DC

## 2023-03-14 NOTE — Assessment & Plan Note (Signed)
In 07/2020, has residual speech difficulty Previous MRI brain reviewed On Aspirin and statin Followed by Neurology 

## 2023-03-14 NOTE — Progress Notes (Signed)
Established Patient Office Visit  Subjective:  Patient ID: Barbara Steele, female    DOB: Feb 07, 1961  Age: 62 y.o. MRN: [id number]  CC:  Chief Complaint  Patient presents with   Hypertension    Six month follow up    Depression    Six month follow up     HPI Barbara Steele is a 62 y.o. female with past medical history of HTN, RA, myasthenia gravis and CVA (07/2020) with residual speech difficulty who presents for f/u of her chronic medical conditions.  HTN: BP is well-controlled at home. Takes medications regularly. Patient denies headache, dizziness, chest pain, dyspnea or palpitations.  Myasthenia gravis: She has started taking Mestinon.  Followed by neurology.  RA: Followed by Dr. Nickola Major.  She has chronic hip, knee and low back pain.  She sees orthopedic surgeon for hip arthritis s/p right THA.  MDD: She has been feeling depressed, has insomnia, decreased concentration and suicidal thoughts, but denies any suicidal plan.  She reports that she used to be very active before having stroke, but since the stroke, she has been homebound and her finances are handled by her daughter.  She has difficulty with the thought process at times and has to take time to articulate sentence at times.     Past Medical History:  Diagnosis Date   Acute ischemic stroke (HCC) 07/24/2020   Anemia    Arthritis    CVA (cerebral vascular accident) (HCC)    High cholesterol    Hypertension    Intracranial aneurysm 01/05/2021   Mental disorder    Myasthenia gravis (HCC)    Rheumatoid arthritis (HCC)    Stroke [facility])     Past Surgical History:  Procedure Laterality Date   BREAST LUMPECTOMY     CATARACT EXTRACTION Bilateral 01/2022   IR 3D INDEPENDENT WKST  07/17/2021   IR ANGIO INTRA EXTRACRAN SEL COM CAROTID INNOMINATE UNI R MOD SED  07/17/2021   IR ANGIO INTRA EXTRACRAN SEL INTERNAL CAROTID UNI L MOD SED  07/17/2021   IR ANGIO VERTEBRAL SEL VERTEBRAL UNI R MOD SED  07/17/2021   IR US GUIDE  VASC ACCESS RIGHT  07/17/2021   MASTECTOMY Bilateral    thymectomy     TOTAL HIP ARTHROPLASTY Right 12/21/2022   Procedure: RIGHT TOTAL HIP ARTHROPLASTY ANTERIOR APPROACH;  Surgeon: Kathryne Hitch, MD;  Location: [facility] OR;  Service: Orthopedics;  Laterality: Right;    Family History  Problem Relation Age of Onset   Stroke Mother    Dementia Mother    Cancer Sister        cervical   Heart disease Brother    Dementia Maternal Grandmother    Stroke Maternal Grandfather    Heart attack Paternal Grandfather     Social History   Socioeconomic History   Marital status: Divorced    Spouse name: Not on file   Number of children: Not on file   Years of education: Not on file   Highest education level: Not on file  Occupational History   Not on file  Tobacco Use   Smoking status: Never   Smokeless tobacco: Never  Vaping Use   Vaping Use: Never used  Substance and Sexual Activity   Alcohol use: Not Currently   Drug use: Never   Sexual activity: Not Currently    Birth control/protection: Post-menopausal  Other Topics Concern   Not on file  Social History Narrative   Not on file   Social Determinants of Health  Financial Resource Strain: Patient Declined (03/13/2023)   Overall Financial Resource Strain (CARDIA)    Difficulty of Paying Living Expenses: Patient declined  Food Insecurity: Patient Declined (03/13/2023)   Hunger Vital Sign    Worried About Running Out of Food in the Last Year: Patient declined    Ran Out of Food in the Last Year: Patient declined  Transportation Needs: Patient Declined (03/13/2023)   PRAPARE - Administrator, Civil Service (Medical): Patient declined    Lack of Transportation (Non-Medical): Patient declined  Physical Activity: Inactive (09/18/2020)   Exercise Vital Sign    Days of Exercise per Week: 0 days    Minutes of Exercise per Session: 0 min  Stress: Stress Concern Present (09/18/2020)   [employer] of Occupational  Health - Occupational Stress Questionnaire    Feeling of Stress : Very much  Social Connections: Unknown (03/13/2023)   Social Connection and Isolation Panel [NHANES]    Frequency of Communication with Friends and Family: Patient declined    Frequency of Social Gatherings with Friends and Family: Patient declined    Attends Religious Services: Patient declined    Database administrator or Organizations: No    Attends Engineer, structural: Not on file    Marital Status: Patient declined  Intimate Partner Violence: Not At Risk (12/21/2022)   Humiliation, Afraid, Rape, and Kick questionnaire    Fear of Current or Ex-Partner: No    Emotionally Abused: No    Physically Abused: No    Sexually Abused: No    Outpatient Medications Prior to Visit  Medication Sig Dispense Refill   acetaminophen (TYLENOL) 500 MG tablet Take 500 mg by mouth every 6 (six) hours as needed for moderate pain.     amLODipine (NORVASC) 10 MG tablet TAKE 1 TABLET(10 MG) BY MOUTH DAILY 90 tablet 1   aspirin 81 MG chewable tablet Chew 1 tablet (81 mg total) by mouth 2 (two) times daily. 30 tablet 0   atorvastatin (LIPITOR) 40 MG tablet TAKE 1 TABLET(40 MG) BY MOUTH DAILY 30 tablet 11   methocarbamol (ROBAXIN) 500 MG tablet Take 1 tablet (500 mg total) by mouth every 6 (six) hours as needed for muscle spasms. 40 tablet 1   pyridostigmine (MESTINON) 60 MG tablet Take 1 tablet (60 mg total) by mouth 3 (three) times daily. 270 tablet 4   Vitamin D, Ergocalciferol, (DRISDOL) 1.25 MG (50000 UNIT) CAPS capsule TAKE 1 CAPSULE BY MOUTH EVERY 7 DAYS (Patient not taking: Reported on 12/14/2022) 5 capsule 5   sertraline (ZOLOFT) 25 MG tablet Take 1 tablet (25 mg total) by mouth daily. 90 tablet 1   No facility-administered medications prior to visit.    No Known Allergies  ROS Review of Systems  Constitutional:  Negative for chills and fever.  HENT:  Negative for congestion, sinus pressure, sinus pain and sore throat.    Eyes:  Positive for visual disturbance. Negative for pain and discharge.  Respiratory:  Negative for cough and shortness of breath.   Cardiovascular:  Negative for chest pain and palpitations.  Gastrointestinal:  Negative for abdominal pain, constipation, diarrhea, nausea and vomiting.  Endocrine: Negative for polydipsia and polyuria.  Genitourinary:  Negative for dysuria and hematuria.  Musculoskeletal:  Positive for arthralgias and back pain. Negative for neck pain and neck stiffness.  Skin:  Negative for rash.  Neurological:  Positive for speech difficulty. Negative for dizziness, weakness and numbness.  Psychiatric/Behavioral:  Negative for agitation and behavioral problems.  Objective:    Physical Exam Vitals reviewed.  Constitutional:      General: She is not in acute distress.    Appearance: She is not diaphoretic.  HENT:     Head: Normocephalic and atraumatic.     Nose: Nose normal.     Mouth/Throat:     Mouth: Mucous membranes are moist.  Eyes:     General: No scleral icterus.    Extraocular Movements: Extraocular movements intact.     Comments: Right eye visual deficit  Cardiovascular:     Rate and Rhythm: Normal rate and regular rhythm.     Pulses: Normal pulses.     Heart sounds: Normal heart sounds. No murmur heard. Pulmonary:     Breath sounds: Normal breath sounds. No wheezing or rales.  Musculoskeletal:     Cervical back: Neck supple. No tenderness.     Right lower leg: No edema.     Left lower leg: No edema.  Skin:    General: Skin is warm.     Findings: No rash.  Neurological:     General: No focal deficit present.     Mental Status: She is alert and oriented to person, place, and time.     Cranial Nerves: Dysarthria present.     Sensory: No sensory deficit.     Motor: No weakness.  Psychiatric:        Mood and Affect: Mood normal.        Behavior: Behavior normal.     BP 138/82 (BP Location: Left Arm)   Pulse 87   Ht 5\' 7"  (1.702 m)    Wt 177 lb 9.6 oz (80.6 kg)   SpO2 93%   BMI 27.82 kg/m  Wt Readings from Last 3 Encounters:  03/14/23 177 lb 9.6 oz (80.6 kg)  12/21/22 180 lb (81.6 kg)  12/15/22 179 lb 4.8 oz (81.3 kg)    Lab Results  Component Value Date   TSH 0.501 08/05/2021   Lab Results  Component Value Date   WBC 13.2 (H) 12/22/2022   HGB 12.1 12/22/2022   HCT 37.1 12/22/2022   MCV 83.7 12/22/2022   PLT 246 12/22/2022   Lab Results  Component Value Date   NA 139 12/22/2022   K 3.5 12/22/2022   CO2 23 12/22/2022   GLUCOSE 143 (H) 12/22/2022   BUN 9 12/22/2022   CREATININE 0.72 12/22/2022   BILITOT 0.2 (L) 12/15/2022   ALKPHOS 125 12/15/2022   AST 21 12/15/2022   ALT 22 12/15/2022   PROT 8.1 12/15/2022   ALBUMIN 3.9 12/15/2022   CALCIUM 8.3 (L) 12/22/2022   ANIONGAP 8 12/22/2022   EGFR 86 02/26/2022   Lab Results  Component Value Date   CHOL 161 08/05/2021   Lab Results  Component Value Date   HDL 66 08/05/2021   Lab Results  Component Value Date   LDLCALC 79 08/05/2021   Lab Results  Component Value Date   TRIG 85 08/05/2021   Lab Results  Component Value Date   CHOLHDL 2.4 08/05/2021   Lab Results  Component Value Date   HGBA1C 5.2 08/05/2021      Assessment & Plan:   Problem List Items Addressed This Visit       Cardiovascular and Mediastinum   Essential hypertension (Chronic)    BP Readings from Last 1 Encounters:  03/14/23 138/82  Well controlled with Amlodipine 10 mg QD Counseled for compliance with the medications Advised DASH diet and moderate exercise/walking, at least  150 mins/week      Relevant Orders   CBC with Differential/Platelet   CMP14+EGFR     Nervous and Auditory   Myasthenia gravis (HCC)    On Mestinon now Followed by Neurology      Relevant Medications   sertraline (ZOLOFT) 50 MG tablet   Other Relevant Orders   CBC with Differential/Platelet   CMP14+EGFR     Musculoskeletal and Integument   Rheumatoid arthritis (HCC)     Followed by Dr. Dr. Nickola Major Used to take Prednisone for RA and Myasthenia Gravis        Other   History of stroke    In 07/2020, has residual speech difficulty Previous MRI brain reviewed On Aspirin and statin Followed by Neurology      MDD (major depressive disorder) - Primary    Flowsheet Row Office Visit from 03/14/2023 in [facility]  PHQ-9 Total Score 26     Uncontrolled Has been depressed since CVA Likely adjustment disorder On Zoloft 25 mg daily, increased dose of Zoloft to 50 mg daily Needs to engage in outdoor activities      Relevant Medications   sertraline (ZOLOFT) 50 MG tablet   Other Relevant Orders   CBC with Differential/Platelet   CMP14+EGFR   TSH   Mixed hyperlipidemia    On Lipitor      Relevant Orders   Lipid Profile   Other Visit Diagnoses     Hyperglycemia       Relevant Orders   Hemoglobin A1c   Screening for colon cancer       Relevant Orders   Cologuard      Meds ordered this encounter  Medications   sertraline (ZOLOFT) 50 MG tablet    Sig: Take 1 tablet (50 mg total) by mouth daily.    Dispense:  30 tablet    Refill:  1    Dose change - 03/14/23    Follow-up: Return in about 2 months (around 05/14/2023) for HTN and MDD.    Anabel Halon, MD

## 2023-03-14 NOTE — Assessment & Plan Note (Signed)
Followed by Dr. Dr. Hawkes Used to take Prednisone for RA and Myasthenia Gravis 

## 2023-03-14 NOTE — Assessment & Plan Note (Signed)
On Lipitor 

## 2023-03-14 NOTE — Assessment & Plan Note (Signed)
On Mestinon now Followed by Neurology 

## 2023-03-14 NOTE — Assessment & Plan Note (Addendum)
Flowsheet Row Office Visit from 03/14/2023 in [facility]  PHQ-9 Total Score 26      Uncontrolled Has been depressed since CVA Likely adjustment disorder On Zoloft 25 mg daily, increased dose of Zoloft to 50 mg daily Needs to engage in outdoor activities

## 2023-03-14 NOTE — Assessment & Plan Note (Addendum)
BP Readings from Last 1 Encounters:  03/14/23 138/82   Well controlled with Amlodipine 10 mg QD Counseled for compliance with the medications Advised DASH diet and moderate exercise/walking, at least 150 mins/week

## 2023-03-14 NOTE — Patient Instructions (Addendum)
Please start taking Zoloft 50 mg instead of 25 mg once daily.  Please continue to take medications as prescribed.  Please continue to follow low salt diet and perform moderate exercise/walking at least 150 mins/week.

## 2023-03-15 ENCOUNTER — Other Ambulatory Visit: Payer: Self-pay | Admitting: Internal Medicine

## 2023-03-15 DIAGNOSIS — E782 Mixed hyperlipidemia: Secondary | ICD-10-CM

## 2023-03-15 LAB — CMP14+EGFR
ALT: 35 IU/L — ABNORMAL HIGH (ref 0–32)
AST: 21 IU/L (ref 0–40)
Albumin/Globulin Ratio: 1.2
Albumin: 4.3 g/dL (ref 3.9–4.9)
Alkaline Phosphatase: 171 IU/L — ABNORMAL HIGH (ref 44–121)
BUN/Creatinine Ratio: 11 — ABNORMAL LOW (ref 12–28)
BUN: 9 mg/dL (ref 8–27)
Bilirubin Total: 0.3 mg/dL (ref 0.0–1.2)
CO2: 21 mmol/L (ref 20–29)
Calcium: 9.7 mg/dL (ref 8.7–10.3)
Chloride: 107 mmol/L — ABNORMAL HIGH (ref 96–106)
Creatinine, Ser: 0.82 mg/dL (ref 0.57–1.00)
Globulin, Total: 3.6 g/dL (ref 1.5–4.5)
Glucose: 115 mg/dL — ABNORMAL HIGH (ref 70–99)
Potassium: 4.1 mmol/L (ref 3.5–5.2)
Sodium: 143 mmol/L (ref 134–144)
Total Protein: 7.9 g/dL (ref 6.0–8.5)
eGFR: 81 mL/min/{1.73_m2} (ref >59)

## 2023-03-15 LAB — CBC WITH DIFFERENTIAL/PLATELET
Basophils Absolute: 0.1 10*3/uL (ref 0.0–0.2)
Basos: 1 %
EOS (ABSOLUTE): 0.2 10*3/uL (ref 0.0–0.4)
Eos: 3 %
Hematocrit: 42.4 % (ref 34.0–46.6)
Hemoglobin: 13.4 g/dL (ref 11.1–15.9)
Immature Grans (Abs): 0.1 10*3/uL (ref 0.0–0.1)
Immature Granulocytes: 1 %
Lymphocytes Absolute: 2.2 10*3/uL (ref 0.7–3.1)
Lymphs: 38 %
MCH: 25.6 pg — ABNORMAL LOW (ref 26.6–33.0)
MCHC: 31.6 g/dL (ref 31.5–35.7)
MCV: 81 fL (ref 79–97)
Monocytes Absolute: 0.5 10*3/uL (ref 0.1–0.9)
Monocytes: 8 %
Neutrophils Absolute: 2.9 10*3/uL (ref 1.4–7.0)
Neutrophils: 49 %
Platelets: 296 10*3/uL (ref 150–450)
RBC: 5.23 x10E6/uL (ref 3.77–5.28)
RDW: 13.4 % (ref 11.7–15.4)
WBC: 5.9 10*3/uL (ref 3.4–10.8)

## 2023-03-15 LAB — HEMOGLOBIN A1C
Est. average glucose Bld gHb Est-mCnc: 120 mg/dL
Hgb A1c MFr Bld: 5.8 % — ABNORMAL HIGH (ref 4.8–5.6)

## 2023-03-15 LAB — LIPID PANEL
Chol/HDL Ratio: 2.8 ratio (ref 0.0–4.4)
Cholesterol, Total: 194 mg/dL (ref 100–199)
HDL: 70 mg/dL (ref >39)
LDL Chol Calc (NIH): 107 mg/dL — ABNORMAL HIGH (ref 0–99)
Triglycerides: 97 mg/dL (ref 0–149)
VLDL Cholesterol Cal: 17 mg/dL (ref 5–40)

## 2023-03-15 LAB — TSH: TSH: 0.594 u[IU]/mL (ref 0.450–4.500)

## 2023-03-15 MED ORDER — ATORVASTATIN CALCIUM 80 MG PO TABS: 80.0000 mg | ORAL_TABLET | Freq: Every day | ORAL | 3 refills | Status: DC

## 2023-05-13 ENCOUNTER — Ambulatory Visit (INDEPENDENT_AMBULATORY_CARE_PROVIDER_SITE_OTHER): Payer: No Typology Code available for payment source | Admitting: Internal Medicine

## 2023-05-13 ENCOUNTER — Encounter: Payer: Self-pay | Admitting: Internal Medicine

## 2023-05-13 VITALS — BP 138/74 | HR 76 | Ht 67.0 in | Wt 178.2 lb

## 2023-05-13 DIAGNOSIS — G7000 Myasthenia gravis without (acute) exacerbation: Secondary | ICD-10-CM

## 2023-05-13 DIAGNOSIS — I10 Essential (primary) hypertension: Secondary | ICD-10-CM

## 2023-05-13 DIAGNOSIS — F332 Major depressive disorder, recurrent severe without psychotic features: Secondary | ICD-10-CM | POA: Diagnosis not present

## 2023-05-13 DIAGNOSIS — M069 Rheumatoid arthritis, unspecified: Secondary | ICD-10-CM | POA: Diagnosis not present

## 2023-05-13 MED ORDER — SERTRALINE HCL 100 MG PO TABS: 100.0000 mg | ORAL_TABLET | Freq: Every day | ORAL | 3 refills | Status: DC

## 2023-05-13 NOTE — Assessment & Plan Note (Addendum)
BP Readings from Last 1 Encounters:  05/13/23 138/74   Well controlled with Amlodipine 10 mg QD Counseled for compliance with the medications Advised DASH diet and moderate exercise/walking, at least 150 mins/week

## 2023-05-13 NOTE — Assessment & Plan Note (Addendum)
Flowsheet Row Office Visit from 03/14/2023 in [facility]  PHQ-9 Total Score 26      Uncontrolled Has been depressed since CVA Likely adjustment disorder On Zoloft 50 mg daily, increased dose of Zoloft to 100 mg daily Needs to engage in outdoor activities [facility] therapy referral, she prefers to wait for now

## 2023-05-13 NOTE — Patient Instructions (Signed)
Please start taking Zoloft 100 mg once daily.  Please continue to take other medications as prescribed.  Please continue to follow low carb diet and perform moderate exercise/walking at least 150 mins/week.

## 2023-05-13 NOTE — Assessment & Plan Note (Signed)
Followed by Dr. Dr. Hawkes Used to take Prednisone for RA and Myasthenia Gravis 

## 2023-05-13 NOTE — Assessment & Plan Note (Signed)
On Mestinon now Followed by Neurology 

## 2023-05-13 NOTE — Progress Notes (Signed)
Established Patient Office Visit  Subjective:  Patient ID: Barbara Steele, female    DOB: [birth year]  Age: 62 y.o. MRN: [id number]  CC:  Chief Complaint  Patient presents with   Depression    Follow up    HPI Barbara Steele is a 62 y.o. female with past medical history of HTN, RA, myasthenia gravis and CVA (07/2020) with residual speech difficulty who presents for f/u of her chronic medical conditions.  HTN: BP is well-controlled at home. Takes medications regularly. Patient denies headache, dizziness, chest pain, dyspnea or palpitations.  Myasthenia gravis: She has started taking Mestinon.  Followed by neurology.  RA: Followed by Dr. Nickola Major.  She has chronic hip, knee and low back pain.  She sees orthopedic surgeon for hip arthritis s/p right THA.  MDD: She has noticed mild improvement since increasing dose of Zoloft to 50 mg QD. She has been feeling depressed, has insomnia, decreased concentration and suicidal thoughts, but denies any suicidal plan.  She reports that she used to be very active before having stroke, but since the stroke, she has been homebound and her finances are handled by her daughter.  She has difficulty with the thought process at times and has to take time to articulate sentence at times. She prefers to wait for [facility] therapy referral as she is planning to travel to [place].     Past Medical History:  Diagnosis Date   Acute ischemic stroke (HCC) 07/24/2020   Anemia    Arthritis    CVA (cerebral vascular accident) (HCC)    High cholesterol    Hypertension    Intracranial aneurysm 01/05/2021   Mental disorder    Myasthenia gravis (HCC)    Rheumatoid arthritis (HCC)    Stroke [facility])     Past Surgical History:  Procedure Laterality Date   BREAST LUMPECTOMY     CATARACT EXTRACTION Bilateral 01/2022   IR 3D INDEPENDENT WKST  07/17/2021   IR ANGIO INTRA EXTRACRAN SEL COM CAROTID INNOMINATE UNI R MOD SED  07/17/2021   IR ANGIO INTRA EXTRACRAN SEL  INTERNAL CAROTID UNI L MOD SED  07/17/2021   IR ANGIO VERTEBRAL SEL VERTEBRAL UNI R MOD SED  07/17/2021   IR US GUIDE VASC ACCESS RIGHT  07/17/2021   MASTECTOMY Bilateral    thymectomy     TOTAL HIP ARTHROPLASTY Right 12/21/2022   Procedure: RIGHT TOTAL HIP ARTHROPLASTY ANTERIOR APPROACH;  Surgeon: Kathryne Hitch, MD;  Location: [facility] OR;  Service: Orthopedics;  Laterality: Right;    Family History  Problem Relation Age of Onset   Stroke Mother    Dementia Mother    Cancer Sister        cervical   Heart disease Brother    Dementia Maternal Grandmother    Stroke Maternal Grandfather    Heart attack Paternal Grandfather     Social History   Socioeconomic History   Marital status: Divorced    Spouse name: Not on file   Number of children: Not on file   Years of education: Not on file   Highest education level: Not on file  Occupational History   Not on file  Tobacco Use   Smoking status: Never   Smokeless tobacco: Never  Vaping Use   Vaping status: Never Used  Substance and Sexual Activity   Alcohol use: Not Currently   Drug use: Never   Sexual activity: Not Currently    Birth control/protection: Post-menopausal  Other Topics Concern   Not on file  Social History Narrative   Not on file   Social Determinants of Health   Financial Resource Strain: Patient Declined (03/13/2023)   Overall Financial Resource Strain (CARDIA)    Difficulty of Paying Living Expenses: Patient declined  Food Insecurity: Patient Declined (03/13/2023)   Hunger Vital Sign    Worried About Running Out of Food in the Last Year: Patient declined    Ran Out of Food in the Last Year: Patient declined  Transportation Needs: Patient Declined (03/13/2023)   PRAPARE - Administrator, Civil Service (Medical): Patient declined    Lack of Transportation (Non-Medical): Patient declined  Physical Activity: Inactive (09/18/2020)   Exercise Vital Sign    Days of Exercise per Week: 0 days     Minutes of Exercise per Session: 0 min  Stress: Stress Concern Present (09/18/2020)   [employer] of Occupational Health - Occupational Stress Questionnaire    Feeling of Stress : Very much  Social Connections: Unknown (03/13/2023)   Social Connection and Isolation Panel [NHANES]    Frequency of Communication with Friends and Family: Patient declined    Frequency of Social Gatherings with Friends and Family: Patient declined    Attends Religious Services: Patient declined    Database administrator or Organizations: No    Attends Engineer, structural: Not on file    Marital Status: Patient declined  Intimate Partner Violence: Not At Risk (12/21/2022)   Humiliation, Afraid, Rape, and Kick questionnaire    Fear of Current or Ex-Partner: No    Emotionally Abused: No    Physically Abused: No    Sexually Abused: No    Outpatient Medications Prior to Visit  Medication Sig Dispense Refill   acetaminophen (TYLENOL) 500 MG tablet Take 500 mg by mouth every 6 (six) hours as needed for moderate pain.     amLODipine (NORVASC) 10 MG tablet TAKE 1 TABLET(10 MG) BY MOUTH DAILY 90 tablet 1   aspirin 81 MG chewable tablet Chew 1 tablet (81 mg total) by mouth 2 (two) times daily. 30 tablet 0   atorvastatin (LIPITOR) 80 MG tablet Take 1 tablet (80 mg total) by mouth daily. 90 tablet 3   methocarbamol (ROBAXIN) 500 MG tablet Take 1 tablet (500 mg total) by mouth every 6 (six) hours as needed for muscle spasms. 40 tablet 1   pyridostigmine (MESTINON) 60 MG tablet Take 1 tablet (60 mg total) by mouth 3 (three) times daily. 270 tablet 4   sertraline (ZOLOFT) 50 MG tablet Take 1 tablet (50 mg total) by mouth daily. 30 tablet 1   Vitamin D, Ergocalciferol, (DRISDOL) 1.25 MG (50000 UNIT) CAPS capsule TAKE 1 CAPSULE BY MOUTH EVERY 7 DAYS (Patient not taking: Reported on 05/13/2023) 5 capsule 5   No facility-administered medications prior to visit.    No Known Allergies  ROS Review of Systems   Constitutional:  Negative for chills and fever.  HENT:  Negative for congestion, sinus pressure, sinus pain and sore throat.   Eyes:  Positive for visual disturbance. Negative for pain and discharge.  Respiratory:  Negative for cough and shortness of breath.   Cardiovascular:  Negative for chest pain and palpitations.  Gastrointestinal:  Negative for abdominal pain, constipation, diarrhea, nausea and vomiting.  Endocrine: Negative for polydipsia and polyuria.  Genitourinary:  Negative for dysuria and hematuria.  Musculoskeletal:  Positive for arthralgias and back pain. Negative for neck pain and neck stiffness.  Skin:  Negative for rash.  Neurological:  Positive for  speech difficulty. Negative for dizziness, weakness and numbness.  Psychiatric/Behavioral:  Positive for decreased concentration, dysphoric mood and sleep disturbance. Negative for agitation and behavioral problems. The patient is nervous/anxious.       Objective:    Physical Exam Vitals reviewed.  Constitutional:      General: She is not in acute distress.    Appearance: She is not diaphoretic.  HENT:     Head: Normocephalic and atraumatic.     Nose: Nose normal.     Mouth/Throat:     Mouth: Mucous membranes are moist.  Eyes:     General: No scleral icterus.    Extraocular Movements: Extraocular movements intact.     Comments: Right eye visual deficit  Cardiovascular:     Rate and Rhythm: Normal rate and regular rhythm.     Pulses: Normal pulses.     Heart sounds: Normal heart sounds. No murmur heard. Pulmonary:     Breath sounds: Normal breath sounds. No wheezing or rales.  Musculoskeletal:     Cervical back: Neck supple. No tenderness.     Right lower leg: No edema.     Left lower leg: No edema.  Skin:    General: Skin is warm.     Findings: No rash.  Neurological:     General: No focal deficit present.     Mental Status: She is alert and oriented to person, place, and time.     Cranial Nerves:  Dysarthria present.     Sensory: No sensory deficit.     Motor: No weakness.  Psychiatric:        Mood and Affect: Mood is depressed. Affect is flat.        Behavior: Behavior normal.        Thought Content: Thought content does not include homicidal or suicidal ideation.     BP 138/74 (BP Location: Right Arm)   Pulse 76   Ht 5\' 7"  (1.702 m)   Wt 178 lb 3.2 oz (80.8 kg)   SpO2 95%   BMI 27.91 kg/m  Wt Readings from Last 3 Encounters:  05/13/23 178 lb 3.2 oz (80.8 kg)  03/14/23 177 lb 9.6 oz (80.6 kg)  12/21/22 180 lb (81.6 kg)    Lab Results  Component Value Date   TSH 0.594 03/14/2023   Lab Results  Component Value Date   WBC 5.9 03/14/2023   HGB 13.4 03/14/2023   HCT 42.4 03/14/2023   MCV 81 03/14/2023   PLT 296 03/14/2023   Lab Results  Component Value Date   NA 143 03/14/2023   K 4.1 03/14/2023   CO2 21 03/14/2023   GLUCOSE 115 (H) 03/14/2023   BUN 9 03/14/2023   CREATININE 0.82 03/14/2023   BILITOT 0.3 03/14/2023   ALKPHOS 171 (H) 03/14/2023   AST 21 03/14/2023   ALT 35 (H) 03/14/2023   PROT 7.9 03/14/2023   ALBUMIN 4.3 03/14/2023   CALCIUM 9.7 03/14/2023   ANIONGAP 8 12/22/2022   EGFR 81 03/14/2023   Lab Results  Component Value Date   CHOL 194 03/14/2023   Lab Results  Component Value Date   HDL 70 03/14/2023   Lab Results  Component Value Date   LDLCALC 107 (H) 03/14/2023   Lab Results  Component Value Date   TRIG 97 03/14/2023   Lab Results  Component Value Date   CHOLHDL 2.8 03/14/2023   Lab Results  Component Value Date   HGBA1C 5.8 (H) 03/14/2023  Assessment & Plan:   Problem List Items Addressed This Visit       Cardiovascular and Mediastinum   Essential hypertension - Primary (Chronic)    BP Readings from Last 1 Encounters:  05/13/23 138/74   Well controlled with Amlodipine 10 mg QD Counseled for compliance with the medications Advised DASH diet and moderate exercise/walking, at least 150 mins/week         Nervous and Auditory   Myasthenia gravis (HCC)    On Mestinon now Followed by Neurology      Relevant Medications   sertraline (ZOLOFT) 100 MG tablet     Musculoskeletal and Integument   Rheumatoid arthritis (HCC)    Followed by Dr. Dr. Nickola Major Used to take Prednisone for RA and Myasthenia Gravis        Other   MDD (major depressive disorder) (Chronic)    Flowsheet Row Office Visit from 03/14/2023 in [facility]  PHQ-9 Total Score 26      Uncontrolled Has been depressed since CVA Likely adjustment disorder On Zoloft 50 mg daily, increased dose of Zoloft to 100 mg daily Needs to engage in outdoor activities [facility] therapy referral, she prefers to wait for now      Relevant Medications   sertraline (ZOLOFT) 100 MG tablet    Meds ordered this encounter  Medications   sertraline (ZOLOFT) 100 MG tablet    Sig: Take 1 tablet (100 mg total) by mouth daily.    Dispense:  30 tablet    Refill:  3    Dose change - 05/13/23    Follow-up: Return in about 2 months (around 07/13/2023) for MDD.    Anabel Halon, MD

## 2023-07-18 ENCOUNTER — Ambulatory Visit: Payer: No Typology Code available for payment source | Admitting: Internal Medicine

## 2023-07-18 ENCOUNTER — Encounter: Payer: Self-pay | Admitting: Internal Medicine

## 2023-07-18 VITALS — BP 138/82 | HR 76 | Ht 67.0 in | Wt 172.6 lb

## 2023-07-18 DIAGNOSIS — G7000 Myasthenia gravis without (acute) exacerbation: Secondary | ICD-10-CM

## 2023-07-18 DIAGNOSIS — I10 Essential (primary) hypertension: Secondary | ICD-10-CM | POA: Diagnosis not present

## 2023-07-18 DIAGNOSIS — F332 Major depressive disorder, recurrent severe without psychotic features: Secondary | ICD-10-CM

## 2023-07-18 DIAGNOSIS — Z23 Encounter for immunization: Secondary | ICD-10-CM

## 2023-07-18 MED ORDER — SERTRALINE HCL 100 MG PO TABS: 100.0000 mg | ORAL_TABLET | Freq: Every day | ORAL | 3 refills | Status: DC

## 2023-07-18 MED ORDER — AMLODIPINE BESYLATE 10 MG PO TABS: 10.0000 mg | ORAL_TABLET | Freq: Every day | ORAL | 1 refills | Status: DC

## 2023-07-18 NOTE — Progress Notes (Signed)
Established Patient Office Visit  Subjective:  Patient ID: Barbara Steele, female    DOB: 11-17-1960  Age: 62 y.o. MRN: [id number]  CC:  Chief Complaint  Patient presents with   Depression    Two month follow up     HPI Barbara Steele is a 62 y.o. female with past medical history of HTN, RA, myasthenia gravis and CVA (07/2020) with residual speech difficulty who presents for f/u of her chronic medical conditions.  HTN: BP is well-controlled at home. Takes medications regularly. Patient denies headache, dizziness, chest pain, dyspnea or palpitations.  Myasthenia gravis: She has been taking Mestinon inconsistently.  She thinks that it does not help her and does not feel feel like taking it.  Followed by neurology.  She has noticed worsening of fatigue.  RA: Followed by Dr. Nickola Major.  She has chronic hip, knee and low back pain.  She sees orthopedic surgeon for hip arthritis s/p right THA.  MDD: She had noticed mild improvement since increasing dose of Zoloft to 50 mg once daily, but has been feeling worse recently. She has been feeling depressed, has insomnia, decreased concentration and suicidal thoughts, but denies any suicidal plan.  She reports that she used to be very active before having stroke, but since the stroke, she has been homebound and her finances are handled by her daughter.  She has difficulty with the thought process at times and has to take time to articulate sentence at times. She went to [place] to visit her other daughter and brother, and fel better there.     Past Medical History:  Diagnosis Date   Acute ischemic stroke (HCC) 07/24/2020   Anemia    Arthritis    CVA (cerebral vascular accident) (HCC)    High cholesterol    Hypertension    Intracranial aneurysm 01/05/2021   Mental disorder    Myasthenia gravis (HCC)    Rheumatoid arthritis (HCC)    Stroke [facility])     Past Surgical History:  Procedure Laterality Date   BREAST LUMPECTOMY     CATARACT  EXTRACTION Bilateral 01/2022   IR 3D INDEPENDENT WKST  07/17/2021   IR ANGIO INTRA EXTRACRAN SEL COM CAROTID INNOMINATE UNI R MOD SED  07/17/2021   IR ANGIO INTRA EXTRACRAN SEL INTERNAL CAROTID UNI L MOD SED  07/17/2021   IR ANGIO VERTEBRAL SEL VERTEBRAL UNI R MOD SED  07/17/2021   IR US GUIDE VASC ACCESS RIGHT  07/17/2021   MASTECTOMY Bilateral    thymectomy     TOTAL HIP ARTHROPLASTY Right 12/21/2022   Procedure: RIGHT TOTAL HIP ARTHROPLASTY ANTERIOR APPROACH;  Surgeon: Kathryne Hitch, MD;  Location: [facility] OR;  Service: Orthopedics;  Laterality: Right;    Family History  Problem Relation Age of Onset   Stroke Mother    Dementia Mother    Cancer Sister        cervical   Heart disease Brother    Dementia Maternal Grandmother    Stroke Maternal Grandfather    Heart attack Paternal Grandfather     Social History   Socioeconomic History   Marital status: Divorced    Spouse name: Not on file   Number of children: Not on file   Years of education: Not on file   Highest education level: Not on file  Occupational History   Not on file  Tobacco Use   Smoking status: Never   Smokeless tobacco: Never  Vaping Use   Vaping status: Never Used  Substance and  Sexual Activity   Alcohol use: Not Currently   Drug use: Never   Sexual activity: Not Currently    Birth control/protection: Post-menopausal  Other Topics Concern   Not on file  Social History Narrative   Not on file   Social Determinants of Health   Financial Resource Strain: Patient Declined (03/13/2023)   Overall Financial Resource Strain (CARDIA)    Difficulty of Paying Living Expenses: Patient declined  Food Insecurity: Patient Declined (03/13/2023)   Hunger Vital Sign    Worried About Running Out of Food in the Last Year: Patient declined    Ran Out of Food in the Last Year: Patient declined  Transportation Needs: Patient Declined (03/13/2023)   PRAPARE - Administrator, Civil Service (Medical):  Patient declined    Lack of Transportation (Non-Medical): Patient declined  Physical Activity: Inactive (09/18/2020)   Exercise Vital Sign    Days of Exercise per Week: 0 days    Minutes of Exercise per Session: 0 min  Stress: Stress Concern Present (09/18/2020)   [employer] of Occupational Health - Occupational Stress Questionnaire    Feeling of Stress : Very much  Social Connections: Unknown (03/13/2023)   Social Connection and Isolation Panel [NHANES]    Frequency of Communication with Friends and Family: Patient declined    Frequency of Social Gatherings with Friends and Family: Patient declined    Attends Religious Services: Patient declined    Database administrator or Organizations: No    Attends Engineer, structural: Not on file    Marital Status: Patient declined  Intimate Partner Violence: Not At Risk (12/21/2022)   Humiliation, Afraid, Rape, and Kick questionnaire    Fear of Current or Ex-Partner: No    Emotionally Abused: No    Physically Abused: No    Sexually Abused: No    Outpatient Medications Prior to Visit  Medication Sig Dispense Refill   acetaminophen (TYLENOL) 500 MG tablet Take 500 mg by mouth every 6 (six) hours as needed for moderate pain.     aspirin 81 MG chewable tablet Chew 1 tablet (81 mg total) by mouth 2 (two) times daily. 30 tablet 0   atorvastatin (LIPITOR) 80 MG tablet Take 1 tablet (80 mg total) by mouth daily. 90 tablet 3   methocarbamol (ROBAXIN) 500 MG tablet Take 1 tablet (500 mg total) by mouth every 6 (six) hours as needed for muscle spasms. 40 tablet 1   pyridostigmine (MESTINON) 60 MG tablet Take 1 tablet (60 mg total) by mouth 3 (three) times daily. 270 tablet 4   Vitamin D, Ergocalciferol, (DRISDOL) 1.25 MG (50000 UNIT) CAPS capsule TAKE 1 CAPSULE BY MOUTH EVERY 7 DAYS (Patient not taking: Reported on 05/13/2023) 5 capsule 5   amLODipine (NORVASC) 10 MG tablet TAKE 1 TABLET(10 MG) BY MOUTH DAILY 90 tablet 1   sertraline  (ZOLOFT) 100 MG tablet Take 1 tablet (100 mg total) by mouth daily. 30 tablet 3   No facility-administered medications prior to visit.    No Known Allergies  ROS Review of Systems  Constitutional:  Negative for chills and fever.  HENT:  Negative for congestion, sinus pressure, sinus pain and sore throat.   Eyes:  Positive for visual disturbance. Negative for pain and discharge.  Respiratory:  Negative for cough and shortness of breath.   Cardiovascular:  Negative for chest pain and palpitations.  Gastrointestinal:  Negative for abdominal pain, constipation, diarrhea, nausea and vomiting.  Endocrine: Negative for polydipsia and polyuria.  Genitourinary:  Negative for dysuria and hematuria.  Musculoskeletal:  Positive for arthralgias and back pain. Negative for neck pain and neck stiffness.  Skin:  Negative for rash.  Neurological:  Positive for speech difficulty. Negative for dizziness, weakness and numbness.  Psychiatric/Behavioral:  Positive for decreased concentration, dysphoric mood and sleep disturbance. Negative for agitation and behavioral problems. The patient is nervous/anxious.       Objective:    Physical Exam Vitals reviewed.  Constitutional:      General: She is not in acute distress.    Appearance: She is not diaphoretic.  HENT:     Head: Normocephalic and atraumatic.     Nose: Nose normal.     Mouth/Throat:     Mouth: Mucous membranes are moist.  Eyes:     General: No scleral icterus.    Extraocular Movements: Extraocular movements intact.     Comments: Right eye visual deficit  Cardiovascular:     Rate and Rhythm: Normal rate and regular rhythm.     Pulses: Normal pulses.     Heart sounds: Normal heart sounds. No murmur heard. Pulmonary:     Breath sounds: Normal breath sounds. No wheezing or rales.  Musculoskeletal:     Cervical back: Neck supple. No tenderness.     Right lower leg: No edema.     Left lower leg: No edema.  Skin:    General: Skin is  warm.     Findings: No rash.  Neurological:     General: No focal deficit present.     Mental Status: She is alert and oriented to person, place, and time.     Cranial Nerves: Dysarthria present.     Sensory: No sensory deficit.     Motor: No weakness.  Psychiatric:        Mood and Affect: Mood is depressed. Affect is flat.        Behavior: Behavior normal.        Thought Content: Thought content does not include homicidal or suicidal ideation.     BP 138/82 (BP Location: Left Arm)   Pulse 76   Ht 5\' 7"  (1.702 m)   Wt 172 lb 9.6 oz (78.3 kg)   SpO2 95%   BMI 27.03 kg/m  Wt Readings from Last 3 Encounters:  07/18/23 172 lb 9.6 oz (78.3 kg)  05/13/23 178 lb 3.2 oz (80.8 kg)  03/14/23 177 lb 9.6 oz (80.6 kg)    Lab Results  Component Value Date   TSH 0.594 03/14/2023   Lab Results  Component Value Date   WBC 5.9 03/14/2023   HGB 13.4 03/14/2023   HCT 42.4 03/14/2023   MCV 81 03/14/2023   PLT 296 03/14/2023   Lab Results  Component Value Date   NA 143 03/14/2023   K 4.1 03/14/2023   CO2 21 03/14/2023   GLUCOSE 115 (H) 03/14/2023   BUN 9 03/14/2023   CREATININE 0.82 03/14/2023   BILITOT 0.3 03/14/2023   ALKPHOS 171 (H) 03/14/2023   AST 21 03/14/2023   ALT 35 (H) 03/14/2023   PROT 7.9 03/14/2023   ALBUMIN 4.3 03/14/2023   CALCIUM 9.7 03/14/2023   ANIONGAP 8 12/22/2022   EGFR 81 03/14/2023   Lab Results  Component Value Date   CHOL 194 03/14/2023   Lab Results  Component Value Date   HDL 70 03/14/2023   Lab Results  Component Value Date   LDLCALC 107 (H) 03/14/2023   Lab Results  Component Value Date  TRIG 97 03/14/2023   Lab Results  Component Value Date   CHOLHDL 2.8 03/14/2023   Lab Results  Component Value Date   HGBA1C 5.8 (H) 03/14/2023      Assessment & Plan:   Problem List Items Addressed This Visit       Cardiovascular and Mediastinum   Essential hypertension (Chronic)    BP Readings from Last 1 Encounters:  07/18/23  138/82   Well controlled with Amlodipine 10 mg QD Counseled for compliance with the medications Advised DASH diet and moderate exercise/walking, at least 150 mins/week      Relevant Medications   amLODipine (NORVASC) 10 MG tablet     Nervous and Auditory   Myasthenia gravis (HCC)    On Mestinon now, but takes it inconsistently Her fatigue is likely due to worsening of myasthenia gravis Needs to discuss with neurology if she has concern about ineffectiveness of Mestinon rather than stopping it, she expressed understanding Followed by Neurology      Relevant Medications   sertraline (ZOLOFT) 100 MG tablet     Other   MDD (major depressive disorder) - Primary (Chronic)    Flowsheet Row Office Visit from 07/18/2023 in [facility]  PHQ-9 Total Score 20      Uncontrolled Has been depressed since CVA Likely adjustment disorder On Zoloft 50 mg daily, increased dose of Zoloft to 100 mg daily Had lengthy discussion for adherence to an antidepressant and neurologic medications Needs to engage in outdoor activities [facility] therapy referral, she prefers to wait for now      Relevant Medications   sertraline (ZOLOFT) 100 MG tablet   Other Visit Diagnoses     Encounter for immunization       Relevant Orders   Flu vaccine trivalent PF, 6mos and older(Flulaval,Afluria,Fluarix,Fluzone) (Completed)       Meds ordered this encounter  Medications   amLODipine (NORVASC) 10 MG tablet    Sig: Take 1 tablet (10 mg total) by mouth daily.    Dispense:  90 tablet    Refill:  1   sertraline (ZOLOFT) 100 MG tablet    Sig: Take 1 tablet (100 mg total) by mouth daily.    Dispense:  30 tablet    Refill:  3    Dose change - 05/13/23    Follow-up: Return in about 3 months (around 10/18/2023) for MDD.    Anabel Halon, MD

## 2023-07-18 NOTE — Patient Instructions (Addendum)
Please start taking Sertraline 100 mg once daily.  Please continue to take medications as prescribed.  Please continue to follow low salt diet and ambulate as tolerated.

## 2023-07-18 NOTE — Assessment & Plan Note (Addendum)
Flowsheet Row Office Visit from 07/18/2023 in [facility]  PHQ-9 Total Score 20      Uncontrolled Has been depressed since CVA Likely adjustment disorder On Zoloft 50 mg daily, increased dose of Zoloft to 100 mg daily Had lengthy discussion for adherence to an antidepressant and neurologic medications Needs to engage in outdoor activities [facility] therapy referral, she prefers to wait for now

## 2023-07-22 ENCOUNTER — Encounter: Payer: Self-pay | Admitting: Internal Medicine

## 2023-07-22 NOTE — Assessment & Plan Note (Addendum)
BP Readings from Last 1 Encounters:  07/18/23 138/82   Well controlled with Amlodipine 10 mg QD Counseled for compliance with the medications Advised DASH diet and moderate exercise/walking, at least 150 mins/week

## 2023-07-22 NOTE — Assessment & Plan Note (Addendum)
On Mestinon now, but takes it inconsistently Her fatigue is likely due to worsening of myasthenia gravis Needs to discuss with neurology if she has concern about ineffectiveness of Mestinon rather than stopping it, she expressed understanding Followed by Neurology

## 2023-08-08 ENCOUNTER — Ambulatory Visit: Payer: No Typology Code available for payment source | Admitting: Orthopaedic Surgery

## 2023-10-18 ENCOUNTER — Encounter: Payer: Self-pay | Admitting: Internal Medicine

## 2023-10-18 ENCOUNTER — Ambulatory Visit: Payer: No Typology Code available for payment source | Admitting: Internal Medicine

## 2023-10-18 VITALS — BP 138/88 | HR 88 | Ht 67.0 in | Wt 179.8 lb

## 2023-10-18 DIAGNOSIS — G7000 Myasthenia gravis without (acute) exacerbation: Secondary | ICD-10-CM

## 2023-10-18 DIAGNOSIS — Z0001 Encounter for general adult medical examination with abnormal findings: Secondary | ICD-10-CM | POA: Diagnosis not present

## 2023-10-18 DIAGNOSIS — I10 Essential (primary) hypertension: Secondary | ICD-10-CM | POA: Diagnosis not present

## 2023-10-18 DIAGNOSIS — F332 Major depressive disorder, recurrent severe without psychotic features: Secondary | ICD-10-CM

## 2023-10-18 DIAGNOSIS — M069 Rheumatoid arthritis, unspecified: Secondary | ICD-10-CM

## 2023-10-18 DIAGNOSIS — E559 Vitamin D deficiency, unspecified: Secondary | ICD-10-CM

## 2023-10-18 DIAGNOSIS — E782 Mixed hyperlipidemia: Secondary | ICD-10-CM

## 2023-10-18 NOTE — Assessment & Plan Note (Signed)
On Vitamin D supplement

## 2023-10-18 NOTE — Assessment & Plan Note (Addendum)
 On Mestinon now Needs to discuss with neurology if she has concern about ineffectiveness of Mestinon rather than stopping it, she expressed understanding Followed by Neurology

## 2023-10-18 NOTE — Assessment & Plan Note (Signed)
 Physical exam as documented. Counseling done  re healthy lifestyle involving commitment to 150 minutes exercise per week, heart healthy diet, and attaining healthy weight.The importance of adequate sleep also discussed. Immunization and cancer screening needs are specifically addressed at this visit.

## 2023-10-18 NOTE — Progress Notes (Signed)
 Established Patient Office Visit  Subjective:  Patient ID: Barbara Steele, female    DOB: 1961/09/02  Age: 63 y.o. MRN: [id number]  CC:  Chief Complaint  Patient presents with   Depression    3 month f/u   Hypertension   Annual Exam    HPI Barbara Steele is a 63 y.o. female with past medical history of HTN, RA, myasthenia gravis and CVA (07/2020) with residual speech difficulty who presents for annual physical.  HTN: BP is well-controlled at home. Takes medications regularly. Patient denies headache, dizziness, chest pain, dyspnea or palpitations.  Myasthenia gravis: She has been taking Mestinon  consistently now.  Followed by neurology.  She has noticed improvement in fatigue since taking Mestinon  regularly.  RA: Followed by Dr. Ishmael.  She has chronic hip, knee and low back pain.  She sees orthopedic surgeon for hip arthritis s/p right THA.  MDD: She had noticed improvement since increasing dose of Zoloft  to 100 mg once daily. 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 She had been feeling depressed, had insomnia, decreased concentration in the past.  She reports that she used to be very active before having stroke, but since the stroke, she has been homebound and her finances are handled by her daughter.  She has difficulty with the thought process at times and has to take time to articulate sentence at times.    Past Medical History:  Diagnosis Date   Acute ischemic stroke (HCC) 07/24/2020   Anemia    Arthritis    CVA (cerebral vascular accident) (HCC)    High cholesterol    Hypertension    Intracranial aneurysm 01/05/2021   Mental disorder    Myasthenia gravis (HCC)    Rheumatoid arthritis (HCC)    Stroke [facility])     Past Surgical History:  Procedure Laterality Date   BREAST LUMPECTOMY     CATARACT EXTRACTION Bilateral 01/2022   IR 3D INDEPENDENT WKST  07/17/2021   IR ANGIO INTRA EXTRACRAN SEL COM CAROTID INNOMINATE UNI R MOD SED   07/17/2021   IR ANGIO INTRA EXTRACRAN SEL INTERNAL CAROTID UNI L MOD SED  07/17/2021   IR ANGIO VERTEBRAL SEL VERTEBRAL UNI R MOD SED  07/17/2021   IR US  GUIDE VASC ACCESS RIGHT  07/17/2021   MASTECTOMY Bilateral    thymectomy     TOTAL HIP ARTHROPLASTY Right 12/21/2022   Procedure: RIGHT TOTAL HIP ARTHROPLASTY ANTERIOR APPROACH;  Surgeon: Vernetta Lonni GRADE, MD;  Location: [facility] OR;  Service: Orthopedics;  Laterality: Right;    Family History  Problem Relation Age of Onset   Stroke Mother    Dementia Mother    Cancer Sister        cervical   Heart disease Brother    Dementia Maternal Grandmother    Stroke Maternal Grandfather    Heart attack Paternal Grandfather     Social History   Socioeconomic History   Marital status: Divorced    Spouse name: Not on file   Number of children: Not on file   Years of education: Not on file   Highest education level: Not on file  Occupational History   Not on file  Tobacco Use   Smoking status: Never   Smokeless tobacco: Never  Vaping Use   Vaping status: Never Used  Substance and Sexual Activity   Alcohol use: Not Currently   Drug use: Never   Sexual activity: Not Currently    Birth  control/protection: Post-menopausal  Other Topics Concern   Not on file  Social History Narrative   Not on file   Social Drivers of Health   Financial Resource Strain: Patient Declined (03/13/2023)   Overall Financial Resource Strain (CARDIA)    Difficulty of Paying Living Expenses: Patient declined  Food Insecurity: Patient Declined (03/13/2023)   Hunger Vital Sign    Worried About Running Out of Food in the Last Year: Patient declined    Ran Out of Food in the Last Year: Patient declined  Transportation Needs: Patient Declined (03/13/2023)   PRAPARE - Administrator, Civil Service (Medical): Patient declined    Lack of Transportation (Non-Medical): Patient declined  Physical Activity: Inactive (09/18/2020)   Exercise Vital Sign     Days of Exercise per Week: 0 days    Minutes of Exercise per Session: 0 min  Stress: Stress Concern Present (09/18/2020)   [employer] of Occupational Health - Occupational Stress Questionnaire    Feeling of Stress : Very much  Social Connections: Unknown (03/13/2023)   Social Connection and Isolation Panel [NHANES]    Frequency of Communication with Friends and Family: Patient declined    Frequency of Social Gatherings with Friends and Family: Patient declined    Attends Religious Services: Patient declined    Database Administrator or Organizations: No    Attends Engineer, Structural: Not on file    Marital Status: Patient declined  Intimate Partner Violence: Not At Risk (12/21/2022)   Humiliation, Afraid, Rape, and Kick questionnaire    Fear of Current or Ex-Partner: No    Emotionally Abused: No    Physically Abused: No    Sexually Abused: No    Outpatient Medications Prior to Visit  Medication Sig Dispense Refill   acetaminophen  (TYLENOL ) 500 MG tablet Take 500 mg by mouth every 6 (six) hours as needed for moderate pain.     amLODipine  (NORVASC ) 10 MG tablet Take 1 tablet (10 mg total) by mouth daily. 90 tablet 1   aspirin  81 MG chewable tablet Chew 1 tablet (81 mg total) by mouth 2 (two) times daily. 30 tablet 0   atorvastatin  (LIPITOR) 80 MG tablet Take 1 tablet (80 mg total) by mouth daily. 90 tablet 3   methocarbamol  (ROBAXIN ) 500 MG tablet Take 1 tablet (500 mg total) by mouth every 6 (six) hours as needed for muscle spasms. 40 tablet 1   pyridostigmine  (MESTINON ) 60 MG tablet Take 1 tablet (60 mg total) by mouth 3 (three) times daily. 270 tablet 4   sertraline  (ZOLOFT ) 100 MG tablet Take 1 tablet (100 mg total) by mouth daily. 30 tablet 3   Vitamin D , Ergocalciferol , (DRISDOL ) 1.25 MG (50000 UNIT) CAPS capsule TAKE 1 CAPSULE BY MOUTH EVERY 7 DAYS 5 capsule 5   No facility-administered medications prior to visit.    No Known Allergies  ROS Review of Systems   Constitutional:  Negative for chills and fever.  HENT:  Negative for congestion, sinus pressure, sinus pain and sore throat.   Eyes:  Positive for visual disturbance. Negative for pain and discharge.  Respiratory:  Negative for cough and shortness of breath.   Cardiovascular:  Negative for chest pain and palpitations.  Gastrointestinal:  Negative for abdominal pain, constipation, diarrhea, nausea and vomiting.  Endocrine: Negative for polydipsia and polyuria.  Genitourinary:  Negative for dysuria and hematuria.  Musculoskeletal:  Positive for arthralgias and back pain. Negative for neck pain and neck stiffness.  Skin:  Negative for rash.  Neurological:  Positive for speech difficulty. Negative for dizziness, weakness and numbness.  Psychiatric/Behavioral:  Positive for sleep disturbance. Negative for agitation and behavioral problems. The patient is nervous/anxious.       Objective:    Physical Exam Vitals reviewed.  Constitutional:      General: She is not in acute distress.    Appearance: She is not diaphoretic.  HENT:     Head: Normocephalic and atraumatic.     Nose: Nose normal.     Mouth/Throat:     Mouth: Mucous membranes are moist.  Eyes:     General: No scleral icterus.    Extraocular Movements: Extraocular movements intact.     Comments: Right eye visual deficit  Cardiovascular:     Rate and Rhythm: Normal rate and regular rhythm.     Pulses: Normal pulses.     Heart sounds: Normal heart sounds. No murmur heard. Pulmonary:     Breath sounds: Normal breath sounds. No wheezing or rales.  Abdominal:     Palpations: Abdomen is soft.     Tenderness: There is no abdominal tenderness.  Musculoskeletal:     Cervical back: Neck supple. No tenderness.     Right lower leg: No edema.     Left lower leg: No edema.  Skin:    General: Skin is warm.     Findings: No rash.  Neurological:     General: No focal deficit present.     Mental Status: She is alert and oriented to  person, place, and time.     Cranial Nerves: Dysarthria present. No cranial nerve deficit.     Sensory: No sensory deficit.     Motor: No weakness.  Psychiatric:        Mood and Affect: Mood normal.        Behavior: Behavior normal.        Thought Content: Thought content does not include homicidal or suicidal ideation.     BP 138/88 (BP Location: Left Arm)   Pulse 88   Ht 5' 7 (1.702 m)   Wt 179 lb 12.8 oz (81.6 kg)   SpO2 94%   BMI 28.16 kg/m  Wt Readings from Last 3 Encounters:  10/18/23 179 lb 12.8 oz (81.6 kg)  07/18/23 172 lb 9.6 oz (78.3 kg)  05/13/23 178 lb 3.2 oz (80.8 kg)    Lab Results  Component Value Date   TSH 0.594 03/14/2023   Lab Results  Component Value Date   WBC 5.9 03/14/2023   HGB 13.4 03/14/2023   HCT 42.4 03/14/2023   MCV 81 03/14/2023   PLT 296 03/14/2023   Lab Results  Component Value Date   NA 143 03/14/2023   K 4.1 03/14/2023   CO2 21 03/14/2023   GLUCOSE 115 (H) 03/14/2023   BUN 9 03/14/2023   CREATININE 0.82 03/14/2023   BILITOT 0.3 03/14/2023   ALKPHOS 171 (H) 03/14/2023   AST 21 03/14/2023   ALT 35 (H) 03/14/2023   PROT 7.9 03/14/2023   ALBUMIN  4.3 03/14/2023   CALCIUM  9.7 03/14/2023   ANIONGAP 8 12/22/2022   EGFR 81 03/14/2023   Lab Results  Component Value Date   CHOL 194 03/14/2023   Lab Results  Component Value Date   HDL 70 03/14/2023   Lab Results  Component Value Date   LDLCALC 107 (H) 03/14/2023   Lab Results  Component Value Date   TRIG 97 03/14/2023   Lab Results  Component Value  Date   CHOLHDL 2.8 03/14/2023   Lab Results  Component Value Date   HGBA1C 5.8 (H) 03/14/2023      Assessment & Plan:   Problem List Items Addressed This Visit       Cardiovascular and Mediastinum   Essential hypertension (Chronic)   BP Readings from Last 1 Encounters:  10/18/23 138/88   Well controlled with Amlodipine  10 mg QD Counseled for compliance with the medications Advised DASH diet and moderate  exercise/walking, at least 150 mins/week      Relevant Orders   CMP14+EGFR   CBC with Differential/Platelet     Nervous and Auditory   Myasthenia gravis (HCC) - Primary   On Mestinon  now Needs to discuss with neurology if she has concern about ineffectiveness of Mestinon  rather than stopping it, she expressed understanding Followed by Neurology        Musculoskeletal and Integument   Rheumatoid arthritis (HCC)   Followed by Dr. Ishmael Used to take Prednisone for RA and Myasthenia Gravis      Relevant Orders   C-reactive protein     Other   MDD (major depressive disorder) (Chronic)   Flowsheet Row Office Visit from 10/18/2023 in [facility]  PHQ-9 Total Score 0      Well-controlled with Zoloft  100 mg once daily now Had been depressed since CVA Had lengthy discussion for adherence to an antidepressant and neurologic medications Needs to engage in outdoor activities      Relevant Orders   CMP14+EGFR   CBC with Differential/Platelet   Encounter for general adult medical examination with abnormal findings   Physical exam as documented. Counseling done  re healthy lifestyle involving commitment to 150 minutes exercise per week, heart healthy diet, and attaining healthy weight.The importance of adequate sleep also discussed. Immunization and cancer screening needs are specifically addressed at this visit.      Vitamin D  deficiency   On Vitamin D  supplement      Relevant Orders   Vitamin D  (25 hydroxy)   Mixed hyperlipidemia   On Lipitor 80 mg QD        No orders of the defined types were placed in this encounter.   Follow-up: Return in about 3 months (around 01/16/2024) for HTN and MDD.    Barbara MARLA Blanch, MD

## 2023-10-18 NOTE — Patient Instructions (Signed)
 Please continue to take medications as prescribed.  Please continue to follow low carb diet and perform moderate exercise/walking at least 150 mins/week.  Please contact us  if your blood pressure remains above 140/90 at home on 3 consecutive readings.  Please get fasting blood tests done before the next visit.

## 2023-10-18 NOTE — Assessment & Plan Note (Signed)
 BP Readings from Last 1 Encounters:  10/18/23 138/88   Well controlled with Amlodipine 10 mg QD Counseled for compliance with the medications Advised DASH diet and moderate exercise/walking, at least 150 mins/week

## 2023-10-18 NOTE — Assessment & Plan Note (Addendum)
 Followed by Dr. Nickola Major Used to take Prednisone for RA and Myasthenia Gravis

## 2023-10-18 NOTE — Assessment & Plan Note (Addendum)
 Flowsheet Row Office Visit from 10/18/2023 in [facility]  PHQ-9 Total Score 0      Well-controlled with Zoloft  100 mg once daily now Had been depressed since CVA Had lengthy discussion for adherence to an antidepressant and neurologic medications Needs to engage in outdoor activities

## 2023-10-18 NOTE — Assessment & Plan Note (Signed)
 On Lipitor 80 mg QD

## 2024-01-13 ENCOUNTER — Other Ambulatory Visit: Payer: Self-pay | Admitting: Internal Medicine

## 2024-01-13 DIAGNOSIS — I10 Essential (primary) hypertension: Secondary | ICD-10-CM

## 2024-01-16 ENCOUNTER — Ambulatory Visit (INDEPENDENT_AMBULATORY_CARE_PROVIDER_SITE_OTHER): Payer: No Typology Code available for payment source | Admitting: Internal Medicine

## 2024-01-16 ENCOUNTER — Encounter: Payer: Self-pay | Admitting: Internal Medicine

## 2024-01-16 VITALS — BP 144/82 | HR 83 | Ht 67.0 in | Wt 185.0 lb

## 2024-01-16 DIAGNOSIS — I10 Essential (primary) hypertension: Secondary | ICD-10-CM | POA: Diagnosis not present

## 2024-01-16 DIAGNOSIS — Z8673 Personal history of transient ischemic attack (TIA), and cerebral infarction without residual deficits: Secondary | ICD-10-CM | POA: Diagnosis not present

## 2024-01-16 DIAGNOSIS — F332 Major depressive disorder, recurrent severe without psychotic features: Secondary | ICD-10-CM

## 2024-01-16 DIAGNOSIS — E782 Mixed hyperlipidemia: Secondary | ICD-10-CM | POA: Diagnosis not present

## 2024-01-16 MED ORDER — ATORVASTATIN CALCIUM 80 MG PO TABS: 80.0000 mg | ORAL_TABLET | Freq: Every day | ORAL | 3 refills | Status: AC

## 2024-01-16 MED ORDER — SERTRALINE HCL 50 MG PO TABS: 50.0000 mg | ORAL_TABLET | Freq: Every day | ORAL | 0 refills | Status: DC

## 2024-01-16 MED ORDER — AMLODIPINE-OLMESARTAN 5-20 MG PO TABS: 1.0000 | ORAL_TABLET | Freq: Every day | ORAL | 3 refills | Status: DC

## 2024-01-16 NOTE — Assessment & Plan Note (Addendum)
 BP Readings from Last 1 Encounters:  01/16/24 (!) 144/82   Uncontrolled with Amlodipine 10 mg once daily Switched to Azor 5-20 mg QD Counseled for compliance with the medications Advised DASH diet and moderate exercise/walking, at least 150 mins/week

## 2024-01-16 NOTE — Assessment & Plan Note (Signed)
 On Lipitor 80 mg QD

## 2024-01-16 NOTE — Assessment & Plan Note (Signed)
In 07/2020, has residual speech difficulty Previous MRI brain reviewed On Aspirin and statin Followed by Neurology 

## 2024-01-16 NOTE — Assessment & Plan Note (Signed)
 Flowsheet Row Office Visit from 01/16/2024 in [facility]  PHQ-9 Total Score 6      Well-controlled with Zoloft 100 mg once daily now Since she is feeling better and has been staying busy, will decrease dose of Zoloft to 50 mg QD Had been depressed since CVA Had lengthy discussion for adherence to an antidepressant and neurologic medications Needs to engage in outdoor activities

## 2024-01-16 NOTE — Progress Notes (Signed)
 Established Patient Office Visit  Subjective:  Patient ID: Barbara Steele, female    DOB: [birth year]  Age: 63 y.o. MRN: [id number]  CC:  Chief Complaint  Patient presents with   Care Management    3 month f/u    HPI [name] is a 63 y.o. female with past medical history of HTN, RA, myasthenia gravis and CVA (07/2020) with residual speech difficulty who presents for f/u of her chronic medical conditions.  HTN: BP is elevated today, but reports better BP readings at home.. Takes amlodipine 10 mg regularly. Patient denies headache, dizziness, chest pain, dyspnea or palpitations.  Myasthenia gravis: She has been taking Mestinon consistently now.  Followed by neurology.  She has noticed improvement in fatigue since taking Mestinon regularly.  RA: Followed by Dr. Meredith Stalls.  She has chronic hip, knee and low back pain.  She sees orthopedic surgeon for hip arthritis s/p right THA.  MDD: She had noticed improvement with Zoloft 100 mg once daily. 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  She is currently busy with her daughter's new home closing and moving process. Her sleep has improved now. She feels better overall and wants to decrease dose of Zoloft. She had been feeling depressed, had insomnia, decreased concentration in the past.  She reports that she used to be very active before having stroke, but since the stroke, she has been homebound and her finances are handled by her daughter.  She has difficulty with the thought process at times and has to take time to articulate sentence at times.    Past Medical History:  Diagnosis Date   Acute ischemic stroke (HCC) 07/24/2020   Anemia    Arthritis    CVA (cerebral vascular accident) (HCC)    High cholesterol    Hypertension    Intracranial aneurysm 01/05/2021   Mental disorder    Myasthenia gravis (HCC)    Rheumatoid arthritis (HCC)    Stroke [facility])     Past Surgical History:  Procedure Laterality  Date   BREAST LUMPECTOMY     CATARACT EXTRACTION Bilateral 01/2022   IR 3D INDEPENDENT WKST  07/17/2021   IR ANGIO INTRA EXTRACRAN SEL COM CAROTID INNOMINATE UNI R MOD SED  07/17/2021   IR ANGIO INTRA EXTRACRAN SEL INTERNAL CAROTID UNI L MOD SED  07/17/2021   IR ANGIO VERTEBRAL SEL VERTEBRAL UNI R MOD SED  07/17/2021   IR US  GUIDE VASC ACCESS RIGHT  07/17/2021   MASTECTOMY Bilateral    thymectomy     TOTAL HIP ARTHROPLASTY Right 12/21/2022   Procedure: RIGHT TOTAL HIP ARTHROPLASTY ANTERIOR APPROACH;  Surgeon: Arnie Lao, MD;  Location: [facility] OR;  Service: Orthopedics;  Laterality: Right;    Family History  Problem Relation Age of Onset   Stroke Mother    Dementia Mother    Cancer Sister        cervical   Heart disease Brother    Dementia Maternal Grandmother    Stroke Maternal Grandfather    Heart attack Paternal Grandfather     Social History   Socioeconomic History   Marital status: Divorced    Spouse name: Not on file   Number of children: Not on file   Years of education: Not on file   Highest education level: Not on file  Occupational History   Not on file  Tobacco Use   Smoking status: Never   Smokeless tobacco: Never  Vaping Use  Vaping status: Never Used  Substance and Sexual Activity   Alcohol use: Not Currently   Drug use: Never   Sexual activity: Not Currently    Birth control/protection: Post-menopausal  Other Topics Concern   Not on file  Social History Narrative   Not on file   Social Drivers of Health   Financial Resource Strain: Patient Declined (03/13/2023)   Overall Financial Resource Strain (CARDIA)    Difficulty of Paying Living Expenses: Patient declined  Food Insecurity: Patient Declined (03/13/2023)   Hunger Vital Sign    Worried About Running Out of Food in the Last Year: Patient declined    Ran Out of Food in the Last Year: Patient declined  Transportation Needs: Patient Declined (03/13/2023)   PRAPARE - Doctor, general practice (Medical): Patient declined    Lack of Transportation (Non-Medical): Patient declined  Physical Activity: Inactive (09/18/2020)   Exercise Vital Sign    Days of Exercise per Week: 0 days    Minutes of Exercise per Session: 0 min  Stress: Stress Concern Present (09/18/2020)   [employer] of Occupational Health - Occupational Stress Questionnaire    Feeling of Stress : Very much  Social Connections: Unknown (03/13/2023)   Social Connection and Isolation Panel [NHANES]    Frequency of Communication with Friends and Family: Patient declined    Frequency of Social Gatherings with Friends and Family: Patient declined    Attends Religious Services: Patient declined    Database administrator or Organizations: No    Attends Engineer, structural: Not on file    Marital Status: Patient declined  Intimate Partner Violence: Not At Risk (12/21/2022)   Humiliation, Afraid, Rape, and Kick questionnaire    Fear of Current or Ex-Partner: No    Emotionally Abused: No    Physically Abused: No    Sexually Abused: No    Outpatient Medications Prior to Visit  Medication Sig Dispense Refill   acetaminophen (TYLENOL) 500 MG tablet Take 500 mg by mouth every 6 (six) hours as needed for moderate pain.     aspirin 81 MG chewable tablet Chew 1 tablet (81 mg total) by mouth 2 (two) times daily. 30 tablet 0   methocarbamol (ROBAXIN) 500 MG tablet Take 1 tablet (500 mg total) by mouth every 6 (six) hours as needed for muscle spasms. 40 tablet 1   pyridostigmine (MESTINON) 60 MG tablet Take 1 tablet (60 mg total) by mouth 3 (three) times daily. 270 tablet 4   Vitamin D, Ergocalciferol, (DRISDOL) 1.25 MG (50000 UNIT) CAPS capsule TAKE 1 CAPSULE BY MOUTH EVERY 7 DAYS 5 capsule 5   amLODipine (NORVASC) 10 MG tablet TAKE 1 TABLET(10 MG) BY MOUTH DAILY 90 tablet 1   atorvastatin (LIPITOR) 80 MG tablet Take 1 tablet (80 mg total) by mouth daily. 90 tablet 3   sertraline (ZOLOFT) 100 MG  tablet Take 1 tablet (100 mg total) by mouth daily. 30 tablet 3   No facility-administered medications prior to visit.    No Known Allergies  ROS Review of Systems  Constitutional:  Negative for chills and fever.  HENT:  Negative for congestion, sinus pressure, sinus pain and sore throat.   Eyes:  Positive for visual disturbance. Negative for pain and discharge.  Respiratory:  Negative for cough and shortness of breath.   Cardiovascular:  Negative for chest pain and palpitations.  Gastrointestinal:  Negative for abdominal pain, constipation, diarrhea, nausea and vomiting.  Endocrine: Negative for polydipsia and  polyuria.  Genitourinary:  Negative for dysuria and hematuria.  Musculoskeletal:  Positive for arthralgias and back pain. Negative for neck pain and neck stiffness.  Skin:  Negative for rash.  Neurological:  Positive for speech difficulty. Negative for dizziness, weakness and numbness.  Psychiatric/Behavioral:  Positive for decreased concentration and dysphoric mood. Negative for agitation, behavioral problems and sleep disturbance. The patient is nervous/anxious.       Objective:    Physical Exam Vitals reviewed.  Constitutional:      General: She is not in acute distress.    Appearance: She is not diaphoretic.  HENT:     Head: Normocephalic and atraumatic.     Nose: Nose normal.     Mouth/Throat:     Mouth: Mucous membranes are moist.  Eyes:     General: No scleral icterus.    Extraocular Movements: Extraocular movements intact.     Comments: Right eye visual deficit  Cardiovascular:     Rate and Rhythm: Normal rate and regular rhythm.     Heart sounds: Normal heart sounds. No murmur heard. Pulmonary:     Breath sounds: Normal breath sounds. No wheezing or rales.  Musculoskeletal:     Cervical back: Neck supple. No tenderness.     Right lower leg: No edema.     Left lower leg: No edema.  Skin:    General: Skin is warm.     Findings: No rash.  Neurological:      General: No focal deficit present.     Mental Status: She is alert and oriented to person, place, and time.     Cranial Nerves: Dysarthria present. No cranial nerve deficit.     Sensory: No sensory deficit.     Motor: No weakness.  Psychiatric:        Mood and Affect: Mood normal.        Behavior: Behavior normal.        Thought Content: Thought content does not include homicidal or suicidal ideation.     BP (!) 144/82 (BP Location: Left Arm)   Pulse 83   Ht 5\' 7"  (1.702 m)   Wt 185 lb (83.9 kg)   SpO2 96%   BMI 28.98 kg/m  Wt Readings from Last 3 Encounters:  01/16/24 185 lb (83.9 kg)  10/18/23 179 lb 12.8 oz (81.6 kg)  07/18/23 172 lb 9.6 oz (78.3 kg)    Lab Results  Component Value Date   TSH 0.594 03/14/2023   Lab Results  Component Value Date   WBC 5.9 03/14/2023   HGB 13.4 03/14/2023   HCT 42.4 03/14/2023   MCV 81 03/14/2023   PLT 296 03/14/2023   Lab Results  Component Value Date   NA 143 03/14/2023   K 4.1 03/14/2023   CO2 21 03/14/2023   GLUCOSE 115 (H) 03/14/2023   BUN 9 03/14/2023   CREATININE 0.82 03/14/2023   BILITOT 0.3 03/14/2023   ALKPHOS 171 (H) 03/14/2023   AST 21 03/14/2023   ALT 35 (H) 03/14/2023   PROT 7.9 03/14/2023   ALBUMIN 4.3 03/14/2023   CALCIUM 9.7 03/14/2023   ANIONGAP 8 12/22/2022   EGFR 81 03/14/2023   Lab Results  Component Value Date   CHOL 194 03/14/2023   Lab Results  Component Value Date   HDL 70 03/14/2023   Lab Results  Component Value Date   LDLCALC 107 (H) 03/14/2023   Lab Results  Component Value Date   TRIG 97 03/14/2023   Lab Results  Component Value Date   CHOLHDL 2.8 03/14/2023   Lab Results  Component Value Date   HGBA1C 5.8 (H) 03/14/2023      Assessment & Plan:   Problem List Items Addressed This Visit       Cardiovascular and Mediastinum   Essential hypertension - Primary (Chronic)   BP Readings from Last 1 Encounters:  01/16/24 (!) 144/82   Uncontrolled with Amlodipine 10  mg once daily Switched to Azor 5-20 mg QD Counseled for compliance with the medications Advised DASH diet and moderate exercise/walking, at least 150 mins/week      Relevant Medications   amLODipine-olmesartan (AZOR) 5-20 MG tablet   atorvastatin (LIPITOR) 80 MG tablet     Other   MDD (major depressive disorder), recurrent (Chronic)   Flowsheet Row Office Visit from 01/16/2024 in [facility]  PHQ-9 Total Score 6      Well-controlled with Zoloft 100 mg once daily now Since she is feeling better and has been staying busy, will decrease dose of Zoloft to 50 mg QD Had been depressed since CVA Had lengthy discussion for adherence to an antidepressant and neurologic medications Needs to engage in outdoor activities      Relevant Medications   sertraline (ZOLOFT) 50 MG tablet   History of stroke   In 07/2020, has residual speech difficulty Previous MRI brain reviewed On Aspirin and statin Followed by Neurology      Mixed hyperlipidemia   On Lipitor 80 mg QD      Relevant Medications   amLODipine-olmesartan (AZOR) 5-20 MG tablet   atorvastatin (LIPITOR) 80 MG tablet      Meds ordered this encounter  Medications   DISCONTD: sertraline (ZOLOFT) 50 MG tablet    Sig: Take 1 tablet (50 mg total) by mouth daily.    Dispense:  90 tablet    Refill:  0    Dose change - 01/16/24   amLODipine-olmesartan (AZOR) 5-20 MG tablet    Sig: Take 1 tablet by mouth daily.    Dispense:  30 tablet    Refill:  3   atorvastatin (LIPITOR) 80 MG tablet    Sig: Take 1 tablet (80 mg total) by mouth daily.    Dispense:  90 tablet    Refill:  3   sertraline (ZOLOFT) 50 MG tablet    Sig: Take 1 tablet (50 mg total) by mouth daily.    Dispense:  90 tablet    Refill:  0    Dose change - 01/16/24    Follow-up: Return in about 4 months (around 05/17/2024) for HTN.    Meldon Sport, MD

## 2024-01-16 NOTE — Patient Instructions (Signed)
 Please start taking amlodipine-olmesartan 5-20 mg once as prescribed.  Please start taking Zoloft 50 mg instead of 100 mg once daily.  Please continue to follow low salt diet and ambulate as tolerated.

## 2024-01-17 LAB — CBC WITH DIFFERENTIAL/PLATELET
Basophils Absolute: 0.1 10*3/uL (ref 0.0–0.2)
Basos: 1 %
EOS (ABSOLUTE): 0.2 10*3/uL (ref 0.0–0.4)
Eos: 2 %
Hematocrit: 43.8 % (ref 34.0–46.6)
Hemoglobin: 14.3 g/dL (ref 11.1–15.9)
Immature Grans (Abs): 0 10*3/uL (ref 0.0–0.1)
Immature Granulocytes: 0 %
Lymphocytes Absolute: 1.9 10*3/uL (ref 0.7–3.1)
Lymphs: 30 %
MCH: 27.3 pg (ref 26.6–33.0)
MCHC: 32.6 g/dL (ref 31.5–35.7)
MCV: 84 fL (ref 79–97)
Monocytes Absolute: 0.6 10*3/uL (ref 0.1–0.9)
Monocytes: 9 %
Neutrophils Absolute: 3.5 10*3/uL (ref 1.4–7.0)
Neutrophils: 58 %
Platelets: 268 10*3/uL (ref 150–450)
RBC: 5.24 x10E6/uL (ref 3.77–5.28)
RDW: 12.9 % (ref 11.7–15.4)
WBC: 6.2 10*3/uL (ref 3.4–10.8)

## 2024-01-17 LAB — CMP14+EGFR
ALT: 17 IU/L (ref 0–32)
AST: 16 IU/L (ref 0–40)
Albumin: 4.1 g/dL (ref 3.9–4.9)
Alkaline Phosphatase: 153 IU/L — ABNORMAL HIGH (ref 44–121)
BUN/Creatinine Ratio: 9 — ABNORMAL LOW (ref 12–28)
BUN: 8 mg/dL (ref 8–27)
Bilirubin Total: 0.4 mg/dL (ref 0.0–1.2)
CO2: 21 mmol/L (ref 20–29)
Calcium: 9.3 mg/dL (ref 8.7–10.3)
Chloride: 107 mmol/L — ABNORMAL HIGH (ref 96–106)
Creatinine, Ser: 0.86 mg/dL (ref 0.57–1.00)
Globulin, Total: 3.5 g/dL (ref 1.5–4.5)
Glucose: 103 mg/dL — ABNORMAL HIGH (ref 70–99)
Potassium: 4.3 mmol/L (ref 3.5–5.2)
Sodium: 143 mmol/L (ref 134–144)
Total Protein: 7.6 g/dL (ref 6.0–8.5)
eGFR: 77 mL/min/{1.73_m2} (ref >59)

## 2024-01-17 LAB — C-REACTIVE PROTEIN: CRP: 4 mg/L (ref 0–10)

## 2024-01-17 LAB — VITAMIN D 25 HYDROXY (VIT D DEFICIENCY, FRACTURES): Vit D, 25-Hydroxy: 25.5 ng/mL — ABNORMAL LOW (ref 30.0–100.0)

## 2024-05-17 ENCOUNTER — Ambulatory Visit (INDEPENDENT_AMBULATORY_CARE_PROVIDER_SITE_OTHER): Admitting: Internal Medicine

## 2024-05-17 ENCOUNTER — Encounter: Payer: Self-pay | Admitting: Internal Medicine

## 2024-05-17 VITALS — BP 136/84 | HR 81 | Ht 67.0 in | Wt 179.4 lb

## 2024-05-17 DIAGNOSIS — G7000 Myasthenia gravis without (acute) exacerbation: Secondary | ICD-10-CM

## 2024-05-17 DIAGNOSIS — M069 Rheumatoid arthritis, unspecified: Secondary | ICD-10-CM | POA: Diagnosis not present

## 2024-05-17 DIAGNOSIS — I10 Essential (primary) hypertension: Secondary | ICD-10-CM

## 2024-05-17 DIAGNOSIS — F332 Major depressive disorder, recurrent severe without psychotic features: Secondary | ICD-10-CM

## 2024-05-17 MED ORDER — SERTRALINE HCL 50 MG PO TABS
50.0000 mg | ORAL_TABLET | Freq: Every day | ORAL | 1 refills | Status: AC
Start: 2024-05-17 — End: ?

## 2024-05-17 MED ORDER — AMLODIPINE-OLMESARTAN 5-20 MG PO TABS
1.0000 | ORAL_TABLET | Freq: Every day | ORAL | 1 refills | Status: AC
Start: 2024-05-17 — End: ?

## 2024-05-17 NOTE — Assessment & Plan Note (Addendum)
 BP Readings from Last 1 Encounters:  05/17/24 136/84   Overall well-controlled with Azor 5-20 mg QD at home Counseled for compliance with the medications Advised DASH diet and moderate exercise/walking, at least 150 mins/week

## 2024-05-17 NOTE — Assessment & Plan Note (Addendum)
 Well-controlled with Zoloft 50 mg once daily now Her PHQ-9 remains elevated due to chronic fatigue and arthralgias from her myasthenia gravis and RA Had been depressed since CVA Had lengthy discussion for adherence to an antidepressant and neurologic medications Needs to engage in outdoor activities

## 2024-05-17 NOTE — Assessment & Plan Note (Signed)
 On Mestinon now Needs to discuss with neurology if she has concern about ineffectiveness of Mestinon rather than stopping it, she expressed understanding Followed by Neurology

## 2024-05-17 NOTE — Progress Notes (Signed)
 Established Patient Office Visit  Subjective:  Patient ID: Barbara Steele, female    DOB: 11/18/1960  Age: 63 y.o. MRN: [id number]  CC:  Chief Complaint  Patient presents with   Medical Management of Chronic Issues    4 month f/u     HPI Barbara Steele is a 63 y.o. female with past medical history of HTN, RA, myasthenia gravis and CVA (07/2020) with residual speech difficulty who presents for f/u of her chronic medical conditions.  HTN: BP is elevated today, but reports better BP readings at home.[name] 5-20 mg regularly. Patient denies headache, dizziness, chest pain, dyspnea or palpitations.  Myasthenia gravis: She has been taking Mestinon inconsistently now.  Followed by neurology.  She has noticed worsening of fatigue recently.  RA: Followed by Dr. Ishmael.  She has chronic hip, knee and low back pain.  She sees orthopedic surgeon for hip arthritis s/p right THA.  MDD: She had noticed improvement even with Zoloft 50 mg once daily (dose was reduced in the last visit after discussion). 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  She is currently busy with her daughter's new home closing and moving process. Her sleep has improved now. She had been feeling depressed, had insomnia, decreased concentration in the past.  She reports that she used to be very active before having stroke, but since the stroke, she has been homebound and her finances are handled by her daughter.  She has difficulty with the thought process at times and has to take time to articulate sentence at times.    Past Medical History:  Diagnosis Date   Acute ischemic stroke (HCC) 07/24/2020   Anemia    Arthritis    CVA (cerebral vascular accident) (HCC)    High cholesterol    Hypertension    Intracranial aneurysm 01/05/2021   Mental disorder    Myasthenia gravis (HCC)    Rheumatoid arthritis (HCC)    Stroke [facility])     Past Surgical History:  Procedure Laterality Date    BREAST LUMPECTOMY     CATARACT EXTRACTION Bilateral 01/2022   IR 3D INDEPENDENT WKST  07/17/2021   IR ANGIO INTRA EXTRACRAN SEL COM CAROTID INNOMINATE UNI R MOD SED  07/17/2021   IR ANGIO INTRA EXTRACRAN SEL INTERNAL CAROTID UNI L MOD SED  07/17/2021   IR ANGIO VERTEBRAL SEL VERTEBRAL UNI R MOD SED  07/17/2021   IR US  GUIDE VASC ACCESS RIGHT  07/17/2021   MASTECTOMY Bilateral    thymectomy     TOTAL HIP ARTHROPLASTY Right 12/21/2022   Procedure: RIGHT TOTAL HIP ARTHROPLASTY ANTERIOR APPROACH;  Surgeon: Vernetta Lonni GRADE, MD;  Location: [facility] OR;  Service: Orthopedics;  Laterality: Right;    Family History  Problem Relation Age of Onset   Stroke Mother    Dementia Mother    Cancer Sister        cervical   Heart disease Brother    Dementia Maternal Grandmother    Stroke Maternal Grandfather    Heart attack Paternal Grandfather     Social History   Socioeconomic History   Marital status: Divorced    Spouse name: Not on file   Number of children: Not on file   Years of education: Not on file   Highest education level: Not on file  Occupational History   Not on file  Tobacco Use   Smoking status: Never   Smokeless tobacco: Never  Vaping Use  Vaping status: Never Used  Substance and Sexual Activity   Alcohol use: Not Currently   Drug use: Never   Sexual activity: Not Currently    Birth control/protection: Post-menopausal  Other Topics Concern   Not on file  Social History Narrative   Not on file   Social Drivers of Health   Financial Resource Strain: Patient Declined (03/13/2023)   Overall Financial Resource Strain (CARDIA)    Difficulty of Paying Living Expenses: Patient declined  Food Insecurity: Patient Declined (03/13/2023)   Hunger Vital Sign    Worried About Running Out of Food in the Last Year: Patient declined    Ran Out of Food in the Last Year: Patient declined  Transportation Needs: Patient Declined (03/13/2023)   PRAPARE - Scientist, research (physical sciences) (Medical): Patient declined    Lack of Transportation (Non-Medical): Patient declined  Physical Activity: Inactive (09/18/2020)   Exercise Vital Sign    Days of Exercise per Week: 0 days    Minutes of Exercise per Session: 0 min  Stress: Stress Concern Present (09/18/2020)   [employer] of Occupational Health - Occupational Stress Questionnaire    Feeling of Stress : Very much  Social Connections: Unknown (03/13/2023)   Social Connection and Isolation Panel    Frequency of Communication with Friends and Family: Patient declined    Frequency of Social Gatherings with Friends and Family: Patient declined    Attends Religious Services: Patient declined    Database administrator or Organizations: No    Attends Engineer, structural: Not on file    Marital Status: Patient declined  Intimate Partner Violence: Not At Risk (12/21/2022)   Humiliation, Afraid, Rape, and Kick questionnaire    Fear of Current or Ex-Partner: No    Emotionally Abused: No    Physically Abused: No    Sexually Abused: No    Outpatient Medications Prior to Visit  Medication Sig Dispense Refill   acetaminophen (TYLENOL) 500 MG tablet Take 500 mg by mouth every 6 (six) hours as needed for moderate pain.     aspirin 81 MG chewable tablet Chew 1 tablet (81 mg total) by mouth 2 (two) times daily. 30 tablet 0   atorvastatin (LIPITOR) 80 MG tablet Take 1 tablet (80 mg total) by mouth daily. 90 tablet 3   methocarbamol (ROBAXIN) 500 MG tablet Take 1 tablet (500 mg total) by mouth every 6 (six) hours as needed for muscle spasms. 40 tablet 1   pyridostigmine (MESTINON) 60 MG tablet Take 1 tablet (60 mg total) by mouth 3 (three) times daily. 270 tablet 4   amLODipine-olmesartan (AZOR) 5-20 MG tablet Take 1 tablet by mouth daily. 30 tablet 3   sertraline (ZOLOFT) 50 MG tablet Take 1 tablet (50 mg total) by mouth daily. 90 tablet 0   Vitamin D, Ergocalciferol, (DRISDOL) 1.25 MG (50000 UNIT) CAPS capsule  TAKE 1 CAPSULE BY MOUTH EVERY 7 DAYS 5 capsule 5   No facility-administered medications prior to visit.    No Known Allergies  ROS Review of Systems  Constitutional:  Negative for chills and fever.  HENT:  Negative for congestion, sinus pressure, sinus pain and sore throat.   Eyes:  Positive for visual disturbance. Negative for pain and discharge.  Respiratory:  Negative for cough and shortness of breath.   Cardiovascular:  Negative for chest pain and palpitations.  Gastrointestinal:  Negative for abdominal pain, constipation, diarrhea, nausea and vomiting.  Endocrine: Negative for polydipsia and polyuria.  Genitourinary:  Negative for dysuria and hematuria.  Musculoskeletal:  Positive for arthralgias and back pain. Negative for neck pain and neck stiffness.  Skin:  Negative for rash.  Neurological:  Positive for speech difficulty. Negative for dizziness, weakness and numbness.  Psychiatric/Behavioral:  Positive for decreased concentration and dysphoric mood. Negative for agitation, behavioral problems and sleep disturbance. The patient is nervous/anxious.       Objective:    Physical Exam Vitals reviewed.  Constitutional:      General: She is not in acute distress.    Appearance: She is not diaphoretic.  HENT:     Head: Normocephalic and atraumatic.     Nose: Nose normal.     Mouth/Throat:     Mouth: Mucous membranes are moist.  Eyes:     General: No scleral icterus.    Extraocular Movements: Extraocular movements intact.     Comments: Right eye visual deficit  Cardiovascular:     Rate and Rhythm: Normal rate and regular rhythm.     Heart sounds: Normal heart sounds. No murmur heard. Pulmonary:     Breath sounds: Normal breath sounds. No wheezing or rales.  Musculoskeletal:     Cervical back: Neck supple. No tenderness.     Right lower leg: No edema.     Left lower leg: No edema.  Skin:    General: Skin is warm.     Findings: No rash.  Neurological:     General:  No focal deficit present.     Mental Status: She is alert and oriented to person, place, and time.     Cranial Nerves: Dysarthria present.     Sensory: No sensory deficit.     Motor: No weakness.  Psychiatric:        Mood and Affect: Mood normal.        Behavior: Behavior normal.        Thought Content: Thought content does not include homicidal or suicidal ideation.     BP 136/84 (BP Location: Left Arm)   Pulse 81   Ht 5' 7 (1.702 m)   Wt 179 lb 6.4 oz (81.4 kg)   SpO2 97%   BMI 28.10 kg/m  Wt Readings from Last 3 Encounters:  05/17/24 179 lb 6.4 oz (81.4 kg)  01/16/24 185 lb (83.9 kg)  10/18/23 179 lb 12.8 oz (81.6 kg)    Lab Results  Component Value Date   TSH 0.594 03/14/2023   Lab Results  Component Value Date   WBC 6.2 01/16/2024   HGB 14.3 01/16/2024   HCT 43.8 01/16/2024   MCV 84 01/16/2024   PLT 268 01/16/2024   Lab Results  Component Value Date   NA 143 01/16/2024   K 4.3 01/16/2024   CO2 21 01/16/2024   GLUCOSE 103 (H) 01/16/2024   BUN 8 01/16/2024   CREATININE 0.86 01/16/2024   BILITOT 0.4 01/16/2024   ALKPHOS 153 (H) 01/16/2024   AST 16 01/16/2024   ALT 17 01/16/2024   PROT 7.6 01/16/2024   ALBUMIN 4.1 01/16/2024   CALCIUM 9.3 01/16/2024   ANIONGAP 8 12/22/2022   EGFR 77 01/16/2024   Lab Results  Component Value Date   CHOL 194 03/14/2023   Lab Results  Component Value Date   HDL 70 03/14/2023   Lab Results  Component Value Date   LDLCALC 107 (H) 03/14/2023   Lab Results  Component Value Date   TRIG 97 03/14/2023   Lab Results  Component Value Date  CHOLHDL 2.8 03/14/2023   Lab Results  Component Value Date   HGBA1C 5.8 (H) 03/14/2023      Assessment & Plan:   Problem List Items Addressed This Visit       Cardiovascular and Mediastinum   Essential hypertension - Primary (Chronic)   BP Readings from Last 1 Encounters:  05/17/24 136/84   Overall well-controlled with Azor 5-20 mg QD at home Counseled for  compliance with the medications Advised DASH diet and moderate exercise/walking, at least 150 mins/week      Relevant Medications   amLODipine-olmesartan (AZOR) 5-20 MG tablet     Nervous and Auditory   Myasthenia gravis (HCC)   On Mestinon now Needs to discuss with neurology if she has concern about ineffectiveness of Mestinon rather than stopping it, she expressed understanding Followed by Neurology      Relevant Medications   sertraline (ZOLOFT) 50 MG tablet     Musculoskeletal and Integument   Rheumatoid arthritis (HCC)   Followed by Dr. Ishmael Used to take Prednisone for RA and Myasthenia Gravis        Other   MDD (major depressive disorder), recurrent (Chronic)   Well-controlled with Zoloft 50 mg once daily now Her PHQ-9 remains elevated due to chronic fatigue and arthralgias from her myasthenia gravis and RA Had been depressed since CVA Had lengthy discussion for adherence to an antidepressant and neurologic medications Needs to engage in outdoor activities      Relevant Medications   sertraline (ZOLOFT) 50 MG tablet       Meds ordered this encounter  Medications   sertraline (ZOLOFT) 50 MG tablet    Sig: Take 1 tablet (50 mg total) by mouth daily.    Dispense:  90 tablet    Refill:  1    Dose change - 01/16/24   amLODipine-olmesartan (AZOR) 5-20 MG tablet    Sig: Take 1 tablet by mouth daily.    Dispense:  90 tablet    Refill:  1    Follow-up: Return in about 4 months (around 09/16/2024) for AWV.    Suzzane MARLA Blanch, MD

## 2024-05-17 NOTE — Assessment & Plan Note (Signed)
 Followed by Dr. Nickola Major Used to take Prednisone for RA and Myasthenia Gravis

## 2024-05-17 NOTE — Patient Instructions (Addendum)
 Please contact Neurology provider for follow up of Myasthenia gravis.  Please continue taking Vitamin D 2000 IU once daily.  Please continue to take medications as prescribed.  Please continue to follow low salt diet and perform moderate exercise/walking as tolerated.

## 2024-05-18 ENCOUNTER — Encounter: Payer: Self-pay | Admitting: Internal Medicine

## 2024-05-25 ENCOUNTER — Encounter: Payer: Self-pay | Admitting: Radiology

## 2024-08-06 ENCOUNTER — Encounter: Payer: Self-pay | Admitting: Radiology

## 2024-09-13 ENCOUNTER — Encounter: Payer: Self-pay | Admitting: Internal Medicine

## 2024-09-13 ENCOUNTER — Ambulatory Visit (INDEPENDENT_AMBULATORY_CARE_PROVIDER_SITE_OTHER): Admitting: Internal Medicine

## 2024-09-13 VITALS — BP 122/80 | HR 95 | Ht 67.0 in | Wt 173.2 lb

## 2024-09-13 DIAGNOSIS — Z23 Encounter for immunization: Secondary | ICD-10-CM | POA: Diagnosis not present

## 2024-09-13 DIAGNOSIS — F332 Major depressive disorder, recurrent severe without psychotic features: Secondary | ICD-10-CM

## 2024-09-13 DIAGNOSIS — M069 Rheumatoid arthritis, unspecified: Secondary | ICD-10-CM | POA: Diagnosis not present

## 2024-09-13 DIAGNOSIS — Z0000 Encounter for general adult medical examination without abnormal findings: Secondary | ICD-10-CM | POA: Diagnosis not present

## 2024-09-13 DIAGNOSIS — G7000 Myasthenia gravis without (acute) exacerbation: Secondary | ICD-10-CM | POA: Diagnosis not present

## 2024-09-13 DIAGNOSIS — I10 Essential (primary) hypertension: Secondary | ICD-10-CM

## 2024-09-13 DIAGNOSIS — F4320 Adjustment disorder, unspecified: Secondary | ICD-10-CM | POA: Insufficient documentation

## 2024-09-13 DIAGNOSIS — E782 Mixed hyperlipidemia: Secondary | ICD-10-CM | POA: Diagnosis not present

## 2024-09-13 MED ORDER — PYRIDOSTIGMINE BROMIDE 60 MG PO TABS: 60.0000 mg | ORAL_TABLET | Freq: Three times a day (TID) | ORAL | 0 refills | Status: AC

## 2024-09-13 MED ORDER — AMLODIPINE-OLMESARTAN 5-20 MG PO TABS: 1.0000 | ORAL_TABLET | Freq: Every day | ORAL | 1 refills | Status: AC

## 2024-09-13 MED ORDER — SERTRALINE HCL 50 MG PO TABS: 50.0000 mg | ORAL_TABLET | Freq: Every day | ORAL | 1 refills | Status: AC

## 2024-09-13 NOTE — Assessment & Plan Note (Addendum)
 On Mestinon , but has not been taking it - refilled for now Needs to discuss with neurology if she has concern about ineffectiveness of Mestinon  rather than stopping it, she expressed understanding

## 2024-09-13 NOTE — Assessment & Plan Note (Addendum)
 Worse recently due to losing her brother Was well-controlled with Zoloft  50 mg QD, but has stopped taking it Needs to restart Zoloft  for MDD Referred to [facility] for grief counseling Her PHQ-9 remains elevated due to chronic fatigue and arthralgias from her myasthenia gravis and RA Had been depressed since CVA Had lengthy discussion for adherence to an antidepressant and neurologic medications Needs to engage in outdoor activities

## 2024-09-13 NOTE — Progress Notes (Signed)
 Chief Complaint  Patient presents with   Medicare Wellness    Awv in person per pcp request.     Subjective:   Barbara Steele is a 63 y.o. female who presents for a Medicare Annual Wellness Visit.  Visit info / Clinical Intake: Medicare Wellness Visit Type:: Subsequent Annual Wellness Visit Medicare Wellness Visit Mode:: In-person (required for WTM) Interpreter Needed?: No Pre-visit prep was completed: yes AWV questionnaire completed by patient prior to visit?: no Living arrangements:: (!) lives alone Patient's Overall Health Status Rating: (!) fair Typical amount of pain: none Does pain affect Barbara life?: no Are you currently prescribed opioids?: no  Dietary Habits and Nutritional Risks How many meals a day?: 2 Eats fruit and vegetables Barbara?: yes Most meals are obtained by: preparing own meals In the last 2 weeks, have you had any of the following?: unintentional weight loss Diabetic:: no  Functional Status Activities of Barbara Living (to include ambulation/medication): Independent Ambulation: Independent Medication Administration: Independent Home Management (perform basic housework or laundry): Independent Manage your own finances?: yes Primary transportation is: driving Concerns about vision?: no *vision screening is required for WTM* Concerns about hearing?: no  Fall Screening Falls in the past year?: 1 Number of falls in past year: 0 Was there an injury with Fall?: 0 Fall Risk Category Calculator: 1 Patient Fall Risk Level: Low Fall Risk  Fall Risk Patient at Risk for Falls Due to: No Fall Risks Fall risk Follow up: Falls evaluation completed  Home and Transportation Safety: All rugs have non-skid backing?: yes All stairs or steps have railings?: yes Grab bars in the bathtub or shower?: yes Have non-skid surface in bathtub or shower?: yes Good home lighting?: yes Regular seat belt use?: yes Hospital stays in the last year:: no  Cognitive  Assessment Difficulty concentrating, remembering, or making decisions? : yes Will 6CIT or Mini Cog be Completed: yes What year is it?: 0 points What month is it?: 0 points Give patient an address phrase to remember (5 components): [address] About what time is it?: 0 points Count backwards from 20 to 1: 2 points Say the months of the year in reverse: 0 points Repeat the address phrase from earlier: 2 points 6 CIT Score: 4 points  Advance Directives (For Healthcare) Does Patient Have a Medical Advance Directive?: No Would patient like information on creating a medical advance directive?: Yes (Inpatient - patient defers creating a medical advance directive at this time - Information given)  Reviewed/Updated  Reviewed/Updated: Reviewed All (Medical, Surgical, Family, Medications, Allergies, Care Teams, Patient Goals); Medical History; Surgical History; Family History; Allergies; Patient Goals; Care Teams; Medications    Allergies (verified) Patient has no known allergies.   Current Medications (verified) Outpatient Encounter Medications as of 09/13/2024  Medication Sig   acetaminophen  (TYLENOL ) 500 MG tablet Take 500 mg by mouth every 6 (six) hours as needed for moderate pain.   aspirin  81 MG chewable tablet Chew 1 tablet (81 mg total) by mouth 2 (two) times Barbara.   atorvastatin  (LIPITOR) 80 MG tablet Take 1 tablet (80 mg total) by mouth Barbara.   methocarbamol  (ROBAXIN ) 500 MG tablet Take 1 tablet (500 mg total) by mouth every 6 (six) hours as needed for muscle spasms.   [DISCONTINUED] amLODipine -olmesartan  (AZOR ) 5-20 MG tablet Take 1 tablet by mouth Barbara.   [DISCONTINUED] pyridostigmine  (MESTINON ) 60 MG tablet Take 1 tablet (60 mg total) by mouth 3 (three) times Barbara.   [DISCONTINUED] sertraline  (ZOLOFT ) 50 MG tablet Take  1 tablet (50 mg total) by mouth Barbara.   amLODipine -olmesartan  (AZOR ) 5-20 MG tablet Take 1 tablet by mouth Barbara.   pyridostigmine  (MESTINON ) 60 MG tablet  Take 1 tablet (60 mg total) by mouth 3 (three) times Barbara. Further refills from Neurology.   sertraline  (ZOLOFT ) 50 MG tablet Take 1 tablet (50 mg total) by mouth Barbara.   No facility-administered encounter medications on file as of 09/13/2024.    History: Past Medical History:  Diagnosis Date   Acute ischemic stroke (HCC) 07/24/2020   Anemia    Arthritis    CVA (cerebral vascular accident) (HCC)    High cholesterol    Hypertension    Intracranial aneurysm 01/05/2021   Mental disorder    Myasthenia gravis (HCC)    Rheumatoid arthritis (HCC)    Stroke [facility])    Past Surgical History:  Procedure Laterality Date   BREAST LUMPECTOMY     CATARACT EXTRACTION Bilateral 01/2022   IR 3D INDEPENDENT WKST  07/17/2021   IR ANGIO INTRA EXTRACRAN SEL COM CAROTID INNOMINATE UNI R MOD SED  07/17/2021   IR ANGIO INTRA EXTRACRAN SEL INTERNAL CAROTID UNI L MOD SED  07/17/2021   IR ANGIO VERTEBRAL SEL VERTEBRAL UNI R MOD SED  07/17/2021   IR US  GUIDE VASC ACCESS RIGHT  07/17/2021   MASTECTOMY Bilateral    thymectomy     TOTAL HIP ARTHROPLASTY Right 12/21/2022   Procedure: RIGHT TOTAL HIP ARTHROPLASTY ANTERIOR APPROACH;  Surgeon: Vernetta Lonni GRADE, MD;  Location: [facility] OR;  Service: Orthopedics;  Laterality: Right;   Family History  Problem Relation Age of Onset   Stroke Mother    Dementia Mother    Cancer Sister        cervical   Heart disease Brother    Dementia Maternal Grandmother    Stroke Maternal Grandfather    Heart attack Paternal Grandfather    Social History   Occupational History   Not on file  Tobacco Use   Smoking status: Never   Smokeless tobacco: Never  Vaping Use   Vaping status: Never Used  Substance and Sexual Activity   Alcohol use: Not Currently   Drug use: Never   Sexual activity: Not Currently    Birth control/protection: Post-menopausal   Tobacco Counseling Counseling given: Yes  SDOH Screenings   Food Insecurity: No Food Insecurity (09/13/2024)   Housing: Unknown (09/13/2024)  Transportation Needs: No Transportation Needs (09/13/2024)  Utilities: Not At Risk (09/13/2024)  Depression (PHQ2-9): High Risk (05/17/2024)  Financial Resource Strain: Patient Declined (03/13/2023)  Physical Activity: Inactive (09/13/2024)  Social Connections: Socially Isolated (09/13/2024)  Stress: Stress Concern Present (09/13/2024)  Tobacco Use: Low Risk (09/13/2024)  Health Literacy: Adequate Health Literacy (09/13/2024)   See flowsheets for full screening details  Depression Screen PHQ 2 & 9 Depression Scale- Over the past 2 weeks, how often have you been bothered by any of the following problems? Little interest or pleasure in doing things: 1 Feeling down, depressed, or hopeless (PHQ Adolescent also includes...irritable): 1 PHQ-2 Total Score: 2 Trouble falling or staying asleep, or sleeping too much: 2 Feeling tired or having little energy: 1 Poor appetite or overeating (PHQ Adolescent also includes...weight loss): 1 Feeling bad about yourself - or that you are a failure or have let yourself or your family down: 2 Trouble concentrating on things, such as reading the newspaper or watching television (PHQ Adolescent also includes...like school work): 2 Moving or speaking so slowly that other people could have noticed. Or the  opposite - being so fidgety or restless that you have been moving around a lot more than usual: 2 Thoughts that you would be better off dead, or of hurting yourself in some way: 0 PHQ-9 Total Score: 12 If you checked off any problems, how difficult have these problems made it for you to do your work, take care of things at home, or get along with other people?: Very difficult     Goals Addressed             This Visit's Progress    Eat Healthy       Maintaining her well being and peace of mind.              Objective:    Today's Vitals   09/13/24 1013 09/13/24 1024  BP: (!) 155/88 122/80  Pulse: 95   SpO2: 95%    Weight: 173 lb 3.2 oz (78.6 kg)   Height: 5' 7 (1.702 m)   PainSc: 0-No pain    Body mass index is 27.13 kg/m.  Hearing/Vision screen No results found. Immunizations and Health Maintenance Health Maintenance  Topic Date Due   DTaP/Tdap/Td (1 - Tdap) Never done   Pneumococcal Vaccine: 50+ Years (1 of 1 - PCV) Never done   COVID-19 Vaccine (3 - 2025-26 season) 06/04/2024   Medicare Annual Wellness (AWV)  09/13/2025   Cervical Cancer Screening (HPV/Pap Cotest)  09/18/2025   Fecal DNA (Cologuard)  04/24/2026   Influenza Vaccine  Completed   Hepatitis C Screening  Completed   HIV Screening  Completed   Zoster Vaccines- Shingrix   Completed   Hepatitis B Vaccines 19-59 Average Risk  Aged Out   HPV VACCINES  Aged Out   Meningococcal B Vaccine  Aged Out        Assessment/Plan:  This is a routine wellness examination for [place].  Assessment & Plan Encounter for Medicare annual wellness exam Screening questionnaire reviewed Age-appropriate cancer screening and vaccinations discussed - prefers to wait for PCV20 vaccine. Had Flu vaccine today. Advance directive form and information provided. Essential hypertension BP Readings from Last 1 Encounters:  09/13/24 122/80   Overall well-controlled with Azor  5-20 mg QD at home Counseled for compliance with the medications Advised DASH diet and moderate exercise/walking, at least 150 mins/week Rheumatoid arthritis involving multiple sites, unspecified whether rheumatoid factor present (HCC) Followed by Dr. Ishmael Used to take Prednisone for RA and Myasthenia Gravis Severe episode of recurrent major depressive disorder, without psychotic features (HCC) Worse recently due to losing her brother Was well-controlled with Zoloft  50 mg QD, but has stopped taking it Needs to restart Zoloft  for MDD Referred to [facility] for grief counseling Her PHQ-9 remains elevated due to chronic fatigue and arthralgias from her myasthenia  gravis and RA Had been depressed since CVA Had lengthy discussion for adherence to an antidepressant and neurologic medications Needs to engage in outdoor activities Myasthenia gravis (HCC) On Mestinon , but has not been taking it - refilled for now Needs to discuss with neurology if she has concern about ineffectiveness of Mestinon  rather than stopping it, she expressed understanding Grief reaction She lost her brother in 09/25, has worsening of MDD since then Feels overwhelmed as she had to go to [place]  and get his home cleared, still undergoing legal paperwork Needs to restart Zoloft  for MDD Referred to [facility] for grief counseling Mixed hyperlipidemia On Lipitor 80 mg QD Encounter for immunization Flu vaccine today.    Patient Care Team:  Tobie Suzzane POUR, MD as PCP - General (Internal Medicine)  I have personally reviewed and noted the following in the patients chart:   Medical and social history Use of alcohol, tobacco or illicit drugs  Current medications and supplements including opioid prescriptions. Functional ability and status Nutritional status Physical activity Advanced directives List of other physicians Hospitalizations, surgeries, and ER visits in previous 12 months Vitals Screenings to include cognitive, depression, and falls Referrals and appointments  Orders Placed This Encounter  Procedures   Flu vaccine trivalent PF, 6mos and older(Flulaval,Afluria,Fluarix,Fluzone)   AMB Referral VBCI Care Management    Referral Priority:   Routine    Referral Type:   Consultation    Referral Reason:   Care Coordination    Number of Visits Requested:   1   In addition, I have reviewed and discussed with patient certain preventive protocols, quality metrics, and best practice recommendations. A written personalized care plan for preventive services as well as general preventive health recommendations were provided to patient.   Suzzane POUR Tobie,  MD   09/13/2024   Return in about 4 months (around 01/12/2025) for MDD and HTN.  After Visit Summary: (In Person-Printed) AVS printed and given to the patient

## 2024-09-13 NOTE — Assessment & Plan Note (Addendum)
 BP Readings from Last 1 Encounters:  09/13/24 122/80   Overall well-controlled with Azor  5-20 mg QD at home Counseled for compliance with the medications Advised DASH diet and moderate exercise/walking, at least 150 mins/week

## 2024-09-13 NOTE — Patient Instructions (Signed)
 Please start taking Zoloft  as discussed.  Please continue to take medications as prescribed.  Please continue to follow low salt diet and perform moderate exercise/walking as tolerated.  [facility] [address] [place], [place] [address] Phone: [phone]

## 2024-09-13 NOTE — Assessment & Plan Note (Addendum)
 On Lipitor 80 mg QD

## 2024-09-13 NOTE — Assessment & Plan Note (Addendum)
 Followed by Dr. Nickola Major Used to take Prednisone for RA and Myasthenia Gravis

## 2024-09-13 NOTE — Assessment & Plan Note (Addendum)
 She lost her brother in 09/25, has worsening of MDD since then Feels overwhelmed as she had to go to [place]  and get his home cleared, still undergoing legal paperwork Needs to restart Zoloft  for MDD Referred to [facility] for grief counseling

## 2024-09-13 NOTE — Assessment & Plan Note (Addendum)
 Screening questionnaire reviewed Age-appropriate cancer screening and vaccinations discussed - prefers to wait for PCV20 vaccine. Had Flu vaccine today. Advance directive form and information provided.

## 2024-09-18 ENCOUNTER — Telehealth: Payer: Self-pay | Admitting: *Deleted

## 2024-09-18 NOTE — Progress Notes (Unsigned)
 Complex Care Management Note Care Guide Note  09/18/2024 Name: Barbara Steele MRN: [id number] DOB: Dec 06, 1960   Complex Care Management Outreach Attempts: An unsuccessful telephone outreach was attempted today to offer the patient information about available complex care management services.  Follow Up Plan:  Additional outreach attempts will be made to offer the patient complex care management information and services.   Encounter Outcome:  No Answer  Thedford Franks, CMA [place]  [facility], [facility] Guide Direct Dial: [phone]  Fax: [phone] Website: [email or web address]

## 2024-09-20 NOTE — Progress Notes (Unsigned)
 Complex Care Management Note Care Guide Note  09/20/2024 Name: Barbara Steele MRN: [id number] DOB: 10-06-1960   Complex Care Management Outreach Attempts: A second unsuccessful outreach was attempted today to offer the patient with information about available complex care management services.  Follow Up Plan:  Additional outreach attempts will be made to offer the patient complex care management information and services.   Encounter Outcome:  No Answer  Thedford Franks, CMA [place]  [facility], [facility] Guide Direct Dial: [phone]  Fax: [phone] Website: [email or web address]

## 2024-09-21 NOTE — Progress Notes (Signed)
 Complex Care Management Note Care Guide Note  09/21/2024 Name: Barbara Steele MRN: [id number] DOB: 1960/12/15   Complex Care Management Outreach Attempts: A third unsuccessful outreach was attempted today to offer the patient with information about available complex care management services.  Follow Up Plan:  No further outreach attempts will be made at this time. We have been unable to contact the patient to offer or enroll patient in complex care management services.  Encounter Outcome:  No Answer  Thedford Franks, CMA [place]  [facility], [facility] Guide Direct Dial: [phone]  Fax: [phone] Website: [email or web address]

## 2025-01-16 ENCOUNTER — Ambulatory Visit: Payer: Self-pay | Admitting: Internal Medicine
